# Patient Record
Sex: Female | Born: 1955 | Race: Black or African American | Hispanic: No | Marital: Married | Smoking: Never smoker
Health system: Southern US, Community
[De-identification: ages and names within clinical notes are randomized; demographics above are authoritative.]

## PROBLEM LIST (undated history)

## (undated) DIAGNOSIS — I700 Atherosclerosis of aorta: Secondary | ICD-10-CM

## (undated) DIAGNOSIS — T7840XA Allergy, unspecified, initial encounter: Secondary | ICD-10-CM

## (undated) DIAGNOSIS — E119 Type 2 diabetes mellitus without complications: Secondary | ICD-10-CM

## (undated) DIAGNOSIS — K219 Gastro-esophageal reflux disease without esophagitis: Secondary | ICD-10-CM

## (undated) DIAGNOSIS — R053 Chronic cough: Secondary | ICD-10-CM

## (undated) DIAGNOSIS — R05 Cough: Secondary | ICD-10-CM

## (undated) DIAGNOSIS — I10 Essential (primary) hypertension: Secondary | ICD-10-CM

## (undated) HISTORY — DX: Type 2 diabetes mellitus without complications: E11.9

## (undated) HISTORY — DX: Atherosclerosis of aorta: I70.0

## (undated) HISTORY — DX: Essential (primary) hypertension: I10

## (undated) HISTORY — DX: Allergy, unspecified, initial encounter: T78.40XA

## (undated) HISTORY — DX: Gastro-esophageal reflux disease without esophagitis: K21.9

## (undated) HISTORY — DX: Chronic cough: R05.3

## (undated) HISTORY — DX: Cough: R05

## (undated) SURGERY — MANOMETRY, ESOPHAGUS
Anesthesia: Choice

---

## 2006-03-21 ENCOUNTER — Encounter: Admission: RE | Admit: 2006-03-21 | Discharge: 2006-06-19 | Payer: Self-pay | Admitting: Internal Medicine

## 2009-04-06 ENCOUNTER — Encounter: Admission: RE | Admit: 2009-04-06 | Discharge: 2009-04-06 | Payer: Self-pay | Admitting: Internal Medicine

## 2011-04-21 ENCOUNTER — Emergency Department (HOSPITAL_COMMUNITY): Payer: 59

## 2011-04-21 ENCOUNTER — Emergency Department (HOSPITAL_COMMUNITY)
Admission: EM | Admit: 2011-04-21 | Discharge: 2011-04-22 | Disposition: A | Discharge status: Home / Self Care | Payer: 59 | Attending: Emergency Medicine | Admitting: Emergency Medicine

## 2011-04-21 DIAGNOSIS — R0602 Shortness of breath: Secondary | ICD-10-CM | POA: Insufficient documentation

## 2011-04-21 DIAGNOSIS — R059 Cough, unspecified: Secondary | ICD-10-CM | POA: Insufficient documentation

## 2011-04-21 DIAGNOSIS — R0982 Postnasal drip: Secondary | ICD-10-CM | POA: Insufficient documentation

## 2011-04-21 DIAGNOSIS — R05 Cough: Secondary | ICD-10-CM | POA: Insufficient documentation

## 2011-05-06 ENCOUNTER — Other Ambulatory Visit: Payer: Self-pay | Admitting: Internal Medicine

## 2011-05-06 DIAGNOSIS — Z1231 Encounter for screening mammogram for malignant neoplasm of breast: Secondary | ICD-10-CM

## 2011-05-17 ENCOUNTER — Ambulatory Visit: Payer: 59

## 2012-04-09 ENCOUNTER — Ambulatory Visit (INDEPENDENT_AMBULATORY_CARE_PROVIDER_SITE_OTHER): Payer: 59 | Admitting: Family Medicine

## 2012-04-09 VITALS — BP 146/84 | HR 81 | Temp 97.9°F | Resp 18 | Ht 61.0 in | Wt 177.0 lb

## 2012-04-09 DIAGNOSIS — Z0000 Encounter for general adult medical examination without abnormal findings: Secondary | ICD-10-CM

## 2012-04-09 MED ORDER — PREDNISONE 20 MG PO TABS: ORAL_TABLET | ORAL | Status: AC

## 2012-04-09 MED ORDER — FLUTICASONE PROPIONATE 50 MCG/ACT NA SUSP: 2.0000 | Freq: Every day | NASAL | Status: DC

## 2012-04-09 NOTE — Progress Notes (Signed)
  Subjective:    Patient ID: Patricia Murphy, female    DOB: 1956-10-22, 56 y.o.   MRN: [id number]  HPI 56 yo female here for cpe.  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  LSIL.  Referred for colpo.  Has been getting paps through gyn since.  Last was last year.  Plans to get pap done there again.  2) mammo - last mammo was 2 years ago.   3) Colonoscopy - had last year.  Was normal.  To repeat in 10 years.    H/o high blood pressure.  At one time was started on lisinopril 10 daily.  Didn't take them for very long.  Has been coughing a lot lately - month.  Mucus does come up - clear.  NO fever or runny nose.  Some sneezing.  Sometimes keeps her up at night.  Taking zyrtec but not helping.   Last ate 5:30 pm.       Review of Systems Negative except as per HPI     Objective:   Physical Exam  Constitutional: She appears well-developed. No distress.  HENT:  Right Ear: Tympanic membrane, external ear and ear canal normal. Tympanic membrane is not injected, not scarred, not perforated, not erythematous, not retracted and not bulging.  Left Ear: Tympanic membrane, external ear and ear canal normal. Tympanic membrane is not injected, not scarred, not perforated, not erythematous, not retracted and not bulging.  Nose: No mucosal edema or rhinorrhea. Right sinus exhibits no maxillary sinus tenderness and no frontal sinus tenderness. Left sinus exhibits no maxillary sinus tenderness and no frontal sinus tenderness.  Mouth/Throat: Uvula is midline, oropharynx is clear and moist and mucous membranes are normal. No oropharyngeal exudate or tonsillar abscesses.  Cardiovascular: Normal rate, regular rhythm, normal heart sounds and intact distal pulses.   No murmur heard. Pulmonary/Chest: Effort normal and breath sounds normal. No respiratory distress. She has no wheezes. She has no rales.  Lymphadenopathy:       Head (right side): No submandibular and no preauricular  adenopathy present.       Head (left side): No submandibular and no preauricular adenopathy present.       Right cervical: No superficial cervical and no posterior cervical adenopathy present.      Left cervical: No superficial cervical and no posterior cervical adenopathy present.       Right: No supraclavicular adenopathy present.       Left: No supraclavicular adenopathy present.  Skin: Skin is warm and dry.          Assessment & Plan:  PE - normal.  Advised to schedule mammo at her earliest convenience.  Gets paps elsewhere  Cough - likely related to allergies.  Pred taper and flonase.

## 2013-02-01 ENCOUNTER — Ambulatory Visit: Payer: 59

## 2013-02-01 ENCOUNTER — Ambulatory Visit (INDEPENDENT_AMBULATORY_CARE_PROVIDER_SITE_OTHER): Payer: 59 | Admitting: Emergency Medicine

## 2013-02-01 VITALS — BP 130/72 | HR 87 | Temp 98.3°F | Resp 16 | Ht 60.5 in | Wt 169.6 lb

## 2013-02-01 DIAGNOSIS — R05 Cough: Secondary | ICD-10-CM

## 2013-02-01 DIAGNOSIS — R1011 Right upper quadrant pain: Secondary | ICD-10-CM

## 2013-02-01 DIAGNOSIS — J209 Acute bronchitis, unspecified: Secondary | ICD-10-CM

## 2013-02-01 DIAGNOSIS — K219 Gastro-esophageal reflux disease without esophagitis: Secondary | ICD-10-CM

## 2013-02-01 LAB — COMPREHENSIVE METABOLIC PANEL
Albumin: 4.3 g/dL (ref 3.5–5.2)
Alkaline Phosphatase: 147 U/L — ABNORMAL HIGH (ref 39–117)
BUN: 13 mg/dL (ref 6–23)
Creat: 0.88 mg/dL (ref 0.50–1.10)
Glucose, Bld: 105 mg/dL — ABNORMAL HIGH (ref 70–99)
Total Bilirubin: 0.6 mg/dL (ref 0.3–1.2)

## 2013-02-01 LAB — POCT CBC
MCH, POC: 27.2 pg (ref 27–31.2)
MCHC: 30.5 g/dL — AB (ref 31.8–35.4)
MCV: 89.1 fL (ref 80–97)
MID (cbc): 0.5 (ref 0–0.9)
POC LYMPH PERCENT: 41.8 %L (ref 10–50)
POC MID %: 8.2 %M (ref 0–12)
Platelet Count, POC: 397 10*3/uL (ref 142–424)
RDW, POC: 14.4 %
WBC: 5.9 10*3/uL (ref 4.6–10.2)

## 2013-02-01 LAB — LIPASE: Lipase: 18 U/L (ref 0–75)

## 2013-02-01 MED ORDER — ALBUTEROL SULFATE HFA 108 (90 BASE) MCG/ACT IN AERS: 2.0000 | INHALATION_SPRAY | RESPIRATORY_TRACT | Status: DC | PRN

## 2013-02-01 MED ORDER — LANSOPRAZOLE 30 MG PO CPDR: 30.0000 mg | DELAYED_RELEASE_CAPSULE | Freq: Every day | ORAL | Status: DC

## 2013-02-01 MED ORDER — HYDROCOD POLST-CHLORPHEN POLST 10-8 MG/5ML PO LQCR: 5.0000 mL | Freq: Two times a day (BID) | ORAL | Status: DC | PRN

## 2013-02-01 NOTE — Patient Instructions (Addendum)

## 2013-02-01 NOTE — Progress Notes (Signed)
[facility] [address], [place] [address] [phone]  Date:  02/01/2013   Name:  Patricia Murphy   DOB:  20-May-1956   MRN:  [id number]  PCP:  Provider Not In System    Chief Complaint: Cough, Gas, Foot Swelling and Gastrophageal Reflux   History of Present Illness:  Patricia Murphy is a 57 y.o. very pleasant female patient who presents with the following:  Has numerous complaints.  Has been taking micardis for this month, thinking it was for GERD, rather than hypertension.  Now has swelling of the ankles for this past week.  No diet changes, chest pain, shortness of breath, immobilization.  Other complaint. Has cough that is productive of mucoid sputum started in January  No wheezing or shortness of breath, nausea or vomiting, coryza.  No orthopnea or PND.  Taking claritin D for allergies.  Denies waterbrash. No history of asthma. Has frequent heartburn with burning in chest at times.  Particular trouble with greasy or fatty food and barbecue. RUQ abdominal pain through to back that is associated with belching and distension.  No nausea or vomiting or fever or chills  There is no problem list on file for this patient.   History reviewed. No pertinent past medical history.  History reviewed. No pertinent past surgical history.  History  Substance Use Topics  . Smoking status: Never Smoker   . Smokeless tobacco: Not on file  . Alcohol Use: No    History reviewed. No pertinent family history.  Allergies  Allergen Reactions  . Penicillins     Medication list has been reviewed and updated.  Current Outpatient Prescriptions on File Prior to Visit  Medication Sig Dispense Refill  . fluticasone (FLONASE) 50 MCG/ACT nasal spray Place 2 sprays into the nose daily.  16 g  6   No current facility-administered medications on file prior to visit.    Review of Systems:  As per HPI, otherwise negative.    Physical Examination: Filed Vitals:   02/01/13 1032  BP: 130/72  Pulse: 87  Temp: 98.3 F (36.8 C)  Resp: 16   Filed Vitals:   02/01/13 1032  Height: 5' 0.5" (1.537 m)  Weight: 169 lb 9.6 oz (76.93 kg)   Body mass index is 32.56 kg/(m^2). Ideal Body Weight: Weight in (lb) to have BMI = 25: 129.9  GEN: WDWN, NAD, Non-toxic, A & O x 3 HEENT: Atraumatic, Normocephalic. Neck supple. No masses, No LAD.  No JVD or HJR Ears and Nose: No external deformity. CV: RRR, No M/G/R. No JVD. No thrill. No extra heart sounds. PULM: CTA B, no wheezes, crackles, rhonchi. No retractions. No resp. distress. No accessory muscle use. ABD: S, NT, ND, +BS. No rebound. No HSM. EXTR: No c/c/e, no calf tenderness NEURO Normal gait.  PSYCH: Normally interactive. Conversant. Not depressed or anxious appearing.  Calm demeanor.    Assessment and Plan: RUQ pain GERD Prevacid COUGH 2nd bronchitis Albuterol MDI tussionex CXR Labs Sono GB if negative a HIDDA scan with CCK  Carmelina Dane, MD  Results for orders placed in visit on 02/01/13  POCT CBC      Result Value Range   WBC 5.9  4.6 - 10.2 K/uL   Lymph, poc 2.5  0.6 - 3.4   POC LYMPH PERCENT 41.8  10 - 50 %L   MID (cbc) 0.5  0 - 0.9   POC MID % 8.2  0 - 12 %M  POC Granulocyte 2.9  2 - 6.9   Granulocyte percent 50.0  37 - 80 %G   RBC 4.70  4.04 - 5.48 M/uL   Hemoglobin 12.8  12.2 - 16.2 g/dL   HCT, POC 40.9  81.1 - 47.9 %   MCV 89.1  80 - 97 fL   MCH, POC 27.2  27 - 31.2 pg   MCHC 30.5 (*) 31.8 - 35.4 g/dL   RDW, POC 91.4     Platelet Count, POC 397  142 - 424 K/uL   MPV 9.4  0 - 99.8 fL    [facility] reading (PRIMARY) by  Dr. Dareen Piano.  Negative chest.

## 2013-02-05 ENCOUNTER — Telehealth: Payer: Self-pay

## 2013-02-05 MED ORDER — METRONID-TETRACYC-BIS SUBSAL PO MISC: ORAL | Status: DC

## 2013-02-05 NOTE — Telephone Encounter (Signed)
Called no answer will try again later

## 2013-02-05 NOTE — Telephone Encounter (Signed)
Dr. Dareen Piano, patient is allergic to Penicillin.  Please order medication for positive H pylori.

## 2013-02-05 NOTE — Telephone Encounter (Signed)
[facility] SAYS THEY RECEIVED LANSOPRAZOLE RX BUT DID NOT RECEIVE ALBUTEROL AND IS ASKING TO SEND IT AGAIN. PATIENT ALSO SAYS THERE WAS A THIRD RX, IT LOOKS LIKE IT WAS TUSSIONEX AND I TOLD HER PATIENT SHOULD HAVE HARD COPY. IF I AM INCORRECT ON THIS THEY WILL ALSO NEED TUSSIONEX SENT IN.

## 2013-02-06 NOTE — Telephone Encounter (Signed)
Pharmacist called back and stated that a prior auth is needed for Prevacid. Called and spoke w/ pt and explained that she needs to pick up the new Rx for her H Pylori that we sent to the pharmacy yesterday. Asked pt if she has taken omeprazole for her stomach before. She stated that she is taking it now, but would rather have the Prevacid the doctor Rxd for her if ins will cover it. Pt stated that she got her inhaler so there is not a problem w/it.   Called and completed a prior auth over the phone for lansoprazole and was advised it is not covered by plan even w/a prior auth. Ins covers omeprazole and pantoprazole (both 20 mg and 40 mg of both meds). Dr Dareen Piano, do you want to Rx one of these for the pt?

## 2013-02-06 NOTE — Progress Notes (Signed)
Reviewed and agree.

## 2013-02-06 NOTE — Telephone Encounter (Signed)
Called pharmacy left message for them to call back about this patient, so I can determine what she needs.

## 2013-02-07 NOTE — Telephone Encounter (Signed)
Pt called back to see if Dr Dareen Piano is going to call in another Rx for her instead of the prevacid that is not covered by ins. I instructed pt again (I had d/w pt yesterday) to p/up her combination Rx w/the Abxs from the pharmacy and to start taking that along with the OTC omeprazole that she already has until we call her back concerning whether Dr Dareen Piano wants to send in another PPI Rx.  Pt called back and stated that she had called pharmacy and they had some question about her medication, but that she would [facility] get it at the [facility] on [address]. I called [facility] [address] to find out the problem and they advised the metronidazole-tetracycline-bismuth pack is called Pylera and wanted to know directions. I instr'd that the sig is directions on package. Pharmacy tech ran claim and it was accepted by ins for a $40 co-pay. I called [facility] and ordered it for the pt at their location and pharmacist stated it has to be ordered and will be in tomorrow. Notified pt and she agreed to check tomorrow w/pharmacy.  Dr Dareen Piano, do you want to Rx pantoprazole or omeprazole 40 for pt to take instead of the prevacid which is not covered by ins, or have the pt continue taking the OTC omeprazole 20 mg? Please advise. It needs to be sent to [facility].

## 2013-02-09 NOTE — Telephone Encounter (Signed)
She can take TWO over the counter prevacid.

## 2013-02-12 NOTE — Telephone Encounter (Signed)
Gave pt instr's to take two OTC prevacid QD which will be the same dose as if she took the Rx strength Prevacid. Pt agreed.

## 2013-03-04 ENCOUNTER — Telehealth: Payer: Self-pay

## 2013-03-04 ENCOUNTER — Ambulatory Visit: Payer: 59

## 2013-03-04 ENCOUNTER — Ambulatory Visit (INDEPENDENT_AMBULATORY_CARE_PROVIDER_SITE_OTHER): Payer: 59 | Admitting: Emergency Medicine

## 2013-03-04 VITALS — BP 126/78 | HR 97 | Temp 98.0°F | Resp 16 | Ht 61.5 in | Wt 161.0 lb

## 2013-03-04 DIAGNOSIS — J209 Acute bronchitis, unspecified: Secondary | ICD-10-CM

## 2013-03-04 DIAGNOSIS — R05 Cough: Secondary | ICD-10-CM

## 2013-03-04 DIAGNOSIS — R059 Cough, unspecified: Secondary | ICD-10-CM

## 2013-03-04 DIAGNOSIS — H539 Unspecified visual disturbance: Secondary | ICD-10-CM

## 2013-03-04 MED ORDER — HYDROCOD POLST-CHLORPHEN POLST 10-8 MG/5ML PO LQCR: 5.0000 mL | Freq: Two times a day (BID) | ORAL | Status: DC | PRN

## 2013-03-04 MED ORDER — ALBUTEROL SULFATE HFA 108 (90 BASE) MCG/ACT IN AERS: 2.0000 | INHALATION_SPRAY | RESPIRATORY_TRACT | Status: DC | PRN

## 2013-03-04 NOTE — Patient Instructions (Addendum)

## 2013-03-04 NOTE — Telephone Encounter (Signed)
Patient states here medication is causing her issues.  Affecting her eye sight.  [phone]

## 2013-03-04 NOTE — Telephone Encounter (Signed)
She states her vision has decreased. I have advised her to return to clinic, she will come in today.

## 2013-03-04 NOTE — Progress Notes (Signed)
[facility] [address] [place] [address] [phone]  Date:  03/04/2013   Name:  Patricia Murphy   DOB:  1956-10-24   MRN:  [id number]  PCP:  Provider Not In System    Chief Complaint: Blurred Vision, Arm Pain and Cough   History of Present Illness:  Patricia Murphy is a 57 y.o. very pleasant female patient who presents with the following:  Seen a month ago with RUQ pain and GERD that have both resolved.  Continues to have persistent non productive cough with some occasional wheezing.  No shortness of breath.  No nausea or vomiting.  No food intolerance.  Describes left eye visual change for past week and says she has a sense of a green film covering her eye with blurred vision.  Now seeing "designs" in her eye.  Claims associated with taking medication for Hpylori.  Patient Active Problem List  Diagnosis  . GERD (gastroesophageal reflux disease)  . RUQ pain    No past medical history on file.  No past surgical history on file.  History  Substance Use Topics  . Smoking status: Never Smoker   . Smokeless tobacco: Not on file  . Alcohol Use: No    No family history on file.  Allergies  Allergen Reactions  . Penicillins     Medication list has been reviewed and updated.  Current Outpatient Prescriptions on File Prior to Visit  Medication Sig Dispense Refill  . albuterol (PROVENTIL HFA;VENTOLIN HFA) 108 (90 BASE) MCG/ACT inhaler Inhale 2 puffs into the lungs every 4 (four) hours as needed for wheezing (cough, shortness of breath or wheezing.).  1 Inhaler  1  . chlorpheniramine-HYDROcodone (TUSSIONEX PENNKINETIC ER) 10-8 MG/5ML LQCR Take 5 mLs by mouth every 12 (twelve) hours as needed (cough).  60 mL  0  . fluticasone (FLONASE) 50 MCG/ACT nasal spray Place 2 sprays into the nose daily.  16 g  6  . lansoprazole (PREVACID) 30 MG capsule Take 1 capsule (30 mg total) by mouth daily.  30 capsule  2  . loratadine-pseudoephedrine (CLARITIN-D  24-HOUR) 10-240 MG per 24 hr tablet Take 1 tablet by mouth daily.      . Metronid-Tetracyc-Bis Subsal MISC As directed on package  224 each  0  . telmisartan (MICARDIS) 20 MG tablet Take 20 mg by mouth daily.       No current facility-administered medications on file prior to visit.    Review of Systems:  As per HPI, otherwise negative.    Physical Examination: Filed Vitals:   03/04/13 1325  BP: 126/78  Pulse: 97  Temp: 98 F (36.7 C)  Resp: 16   Filed Vitals:   03/04/13 1325  Height: 5' 1.5" (1.562 m)  Weight: 161 lb (73.029 kg)   Body mass index is 29.93 kg/(m^2). Ideal Body Weight: Weight in (lb) to have BMI = 25: 134.2  GEN: WDWN, NAD, Non-toxic, A & O x 3 HEENT: Atraumatic, Normocephalic. Neck supple. No masses, No LAD.  Fundi benign.  PRRERLA EOMI conjunctiva and sclera are clear Ears and Nose: No external deformity. CV: RRR, No M/G/R. No JVD. No thrill. No extra heart sounds. PULM: CTA B, no wheezes, crackles, rhonchi. No retractions. No resp. distress. No accessory muscle use. ABD: S, NT, ND, +BS. No rebound. No HSM. EXTR: No c/c/e NEURO Normal gait.  PSYCH: Normally interactive. Conversant. Not depressed or anxious appearing.  Calm demeanor.    Assessment and Plan: Bronchitis with  bronchospasm Use your MDI as directed Vision change Eye consult   Signed,  Phillips Odor, MD   [facility] reading (PRIMARY) by  Dr. Dareen Piano.  Negative chest.

## 2013-03-07 ENCOUNTER — Telehealth: Payer: Self-pay

## 2013-03-07 NOTE — Telephone Encounter (Signed)
Dr. Dareen Piano  Please call Dr. Dione Booze  This is an interesting patient.  Identify yourself to the staff and they have been instructed to get him for you.   [phone]

## 2013-03-08 DIAGNOSIS — H538 Other visual disturbances: Secondary | ICD-10-CM | POA: Insufficient documentation

## 2013-03-08 DIAGNOSIS — H547 Unspecified visual loss: Secondary | ICD-10-CM | POA: Insufficient documentation

## 2013-03-08 NOTE — Progress Notes (Signed)
Reviewed and agree.

## 2013-03-29 DIAGNOSIS — H469 Unspecified optic neuritis: Secondary | ICD-10-CM | POA: Insufficient documentation

## 2013-06-30 DIAGNOSIS — R739 Hyperglycemia, unspecified: Secondary | ICD-10-CM | POA: Insufficient documentation

## 2013-06-30 DIAGNOSIS — I10 Essential (primary) hypertension: Secondary | ICD-10-CM | POA: Insufficient documentation

## 2013-06-30 DIAGNOSIS — T380X5A Adverse effect of glucocorticoids and synthetic analogues, initial encounter: Secondary | ICD-10-CM | POA: Insufficient documentation

## 2013-07-26 ENCOUNTER — Other Ambulatory Visit: Payer: Self-pay

## 2013-07-26 DIAGNOSIS — Z1231 Encounter for screening mammogram for malignant neoplasm of breast: Secondary | ICD-10-CM

## 2013-08-01 ENCOUNTER — Ambulatory Visit (INDEPENDENT_AMBULATORY_CARE_PROVIDER_SITE_OTHER): Payer: 59 | Admitting: Family Medicine

## 2013-08-01 ENCOUNTER — Ambulatory Visit: Payer: 59

## 2013-08-01 VITALS — BP 178/94 | HR 88 | Temp 98.2°F | Resp 16 | Ht 61.5 in | Wt 177.0 lb

## 2013-08-01 DIAGNOSIS — W19XXXA Unspecified fall, initial encounter: Secondary | ICD-10-CM

## 2013-08-01 DIAGNOSIS — R05 Cough: Secondary | ICD-10-CM

## 2013-08-01 DIAGNOSIS — M25572 Pain in left ankle and joints of left foot: Secondary | ICD-10-CM

## 2013-08-01 DIAGNOSIS — M25559 Pain in unspecified hip: Secondary | ICD-10-CM

## 2013-08-01 DIAGNOSIS — M25562 Pain in left knee: Secondary | ICD-10-CM

## 2013-08-01 DIAGNOSIS — M25569 Pain in unspecified knee: Secondary | ICD-10-CM

## 2013-08-01 DIAGNOSIS — M25579 Pain in unspecified ankle and joints of unspecified foot: Secondary | ICD-10-CM

## 2013-08-01 DIAGNOSIS — M25552 Pain in left hip: Secondary | ICD-10-CM

## 2013-08-01 MED ORDER — OMEPRAZOLE 40 MG PO CPDR: 40.0000 mg | DELAYED_RELEASE_CAPSULE | Freq: Every day | ORAL | Status: DC

## 2013-08-01 MED ORDER — HYDROCOD POLST-CHLORPHEN POLST 10-8 MG/5ML PO LQCR: 5.0000 mL | Freq: Two times a day (BID) | ORAL | Status: DC | PRN

## 2013-08-01 MED ORDER — BENZONATATE 100 MG PO CAPS: 100.0000 mg | ORAL_CAPSULE | Freq: Three times a day (TID) | ORAL | Status: DC | PRN

## 2013-08-01 MED ORDER — MELOXICAM 15 MG PO TABS: 15.0000 mg | ORAL_TABLET | Freq: Every day | ORAL | Status: DC

## 2013-08-01 NOTE — Patient Instructions (Addendum)
1.  ICE KNEE AND ANKLE TWICE DAILY FOR 15 MINUTES FOR THE NEXT FIVE DAYS. 2.  WEAR KNEE BRACE FOR SUPPORT FOR THE NEXT 1-2 WEEKS. 3. IF NO IMPROVEMENT IN TWO WEEKS, CALL FOR REFERRAL TO ORTHOPEDIC SPECIALIST. 4.  WEAR SUPPORTIVE TENNIS SHOES FOR THE NEXT 1-2 WEEKS.  NO FLIP FLOPS; NO BAREFOOT.

## 2013-08-01 NOTE — Progress Notes (Signed)
[address]   [place], [place]  [address]   [phone]  Subjective:    Patient ID: Patricia Murphy, female    DOB: 1956/08/31, 57 y.o.   MRN: [id number]  HPI This 57 y.o. female presents for evaluation of the following:  1.  L leg pain: at funeral today; walking and fell on steps.  Missed a step; landed on L leg; L leg twisted.  Toe bend tall the way back.  L ankle pain, L knee pain, L hip/thigh pain.  2.  R ankle swelling:  Also having mild lateral ankle pain; mild limping.    3.  L knee pain:  After fall today.  L knee swelling; +TTP.  Pain with weightbearing. No popping; no giving out.  4.  L thigh pain: onset after fall today.  Pain in lateral thigh.    5. Cough: chronic cough for past six months; has established with PCP/[facility]; s/p pulmonology consult; concern for sarcoidosis but lung biopsy negative.  No asthma per pulmonology.  Dr. Allyne Gee is concerned of GERD induced cough.  Taking Omeprazole.  No GI consultation.    6. HTN: stopped micardis; no medication: BP has been stable.  Review of Systems  Constitutional: Negative for fever, chills, diaphoresis and fatigue.  Respiratory: Positive for cough. Negative for shortness of breath, wheezing and stridor.   Musculoskeletal: Positive for myalgias, joint swelling, arthralgias and gait problem. Negative for back pain.  Skin: Negative for wound.  Neurological: Negative for weakness.   Past Medical History  Diagnosis Date  . Allergy    History reviewed. No pertinent past surgical history. Allergies  Allergen Reactions  . Ivp Dye [Iodinated Diagnostic Agents] Nausea And Vomiting  . Penicillins    Current Outpatient Prescriptions on File Prior to Visit  Medication Sig Dispense Refill  . omeprazole (PRILOSEC) 20 MG capsule Take 20 mg by mouth 2 (two) times daily.      Marland Kitchen albuterol (PROVENTIL HFA;VENTOLIN HFA) 108 (90 BASE) MCG/ACT inhaler Inhale 2 puffs into the lungs every 4 (four) hours as needed for wheezing (cough,  shortness of breath or wheezing.).  1 Inhaler  12  . bismuth-metronidazole-tetracycline (PLYERA) 140-125-125 MG per capsule Take 3 capsules by mouth 4 (four) times daily -  before meals and at bedtime.      . chlorpheniramine-HYDROcodone (TUSSIONEX PENNKINETIC ER) 10-8 MG/5ML LQCR Take 5 mLs by mouth every 12 (twelve) hours as needed (cough).  60 mL  0  . fluticasone (FLONASE) 50 MCG/ACT nasal spray Place 2 sprays into the nose daily.  16 g  6  . lansoprazole (PREVACID) 30 MG capsule Take 1 capsule (30 mg total) by mouth daily.  30 capsule  2  . loratadine-pseudoephedrine (CLARITIN-D 24-HOUR) 10-240 MG per 24 hr tablet Take 1 tablet by mouth daily.      . Metronid-Tetracyc-Bis Subsal MISC As directed on package  224 each  0  . telmisartan (MICARDIS) 20 MG tablet Take 20 mg by mouth daily.       No current facility-administered medications on file prior to visit.   History   Social History  . Marital Status: Married    Spouse Name: N/A    Number of Children: N/A  . Years of Education: N/A   Occupational History  . Not on file.   Social History Main Topics  . Smoking status: Never Smoker   . Smokeless tobacco: Not on file  . Alcohol Use: No  . Drug Use: No  . Sexual Activity:  Yes    Birth Control/ Protection: None   Other Topics Concern  . Not on file   Social History Narrative  . No narrative on file   Ms. Sproles does not currently have medications on file.     Objective:   Physical Exam  Nursing note and vitals reviewed. Constitutional: She is oriented to person, place, and time. She appears well-developed and well-nourished. No distress.  HENT:  Head: Normocephalic and atraumatic.  Cardiovascular: Normal rate, regular rhythm and normal heart sounds.  Exam reveals no gallop and no friction rub.   No murmur heard. Pulmonary/Chest: Effort normal and breath sounds normal. She has no wheezes. She has no rales.  Musculoskeletal:       Left hip: She exhibits tenderness.  She exhibits normal range of motion, normal strength, no bony tenderness and no crepitus.       Left knee: She exhibits decreased range of motion, swelling, effusion and bony tenderness. She exhibits no ecchymosis, no erythema, normal alignment, no LCL laxity, normal meniscus and no MCL laxity. Tenderness found. No medial joint line, no lateral joint line, no MCL, no LCL and no patellar tendon tenderness noted.       Right ankle: She exhibits decreased range of motion and swelling. She exhibits no ecchymosis, no deformity and no laceration. Tenderness. Lateral malleolus tenderness found. No medial malleolus, no AITFL, no CF ligament, no posterior TFL, no head of 5th metatarsal and no proximal fibula tenderness found. Achilles tendon normal.       Left ankle: She exhibits decreased range of motion. She exhibits no swelling, no ecchymosis and no deformity. Tenderness. Lateral malleolus tenderness found. No medial malleolus, no AITFL, no CF ligament, no posterior TFL, no head of 5th metatarsal and no proximal fibula tenderness found. Achilles tendon normal.       Legs:      Feet:  Neurological: She is alert and oriented to person, place, and time. No cranial nerve deficit. She exhibits normal muscle tone. Coordination normal.  Skin: Skin is warm and dry. No rash noted. She is not diaphoretic.  Psychiatric: She has a normal mood and affect. Her behavior is normal.    [facility] reading (PRIMARY) by  Dr. Katrinka Blazing.  L HIP: nad  L KNEE: nad; diffuse arthritic changes and spurring.   L ANKLE:  nad.      Assessment & Plan:  Knee pain, acute, left - Plan: DG Knee Complete 4 Views Left  Pain in joint, ankle and foot, left - Plan: DG Ankle Complete Left  Hip pain, acute, left - Plan: DG Hip Complete Left  Fall, initial encounter  Cough  1. L knee pain/contusion:  New.  Onset after fall; recommend rest, elevate, ice, Mobic.  If no improvement in two weeks, call for ortho referral.  Knee brace hinged fitted in  office. 2.  L ankle pain/strain:  New.  Secondary to fall.  Recommend rest, ice, elevation.  Rx for Mobic provided. 3.  L hip pain/contusion: New. Onset after fall.  Recommend rest, ice.   4.  Fall:  New. With multiple contusions of LLE.   5. Chronic cough:  Uncontrolled; undergoing evaluation by pulmonology and PCP.  Rx for Tussionex and Tessalon Perles provided in office; constant hacking cough during visit.  Work up by pulmonology negative; rx for Omeprazole 40mg  daily provided to treat underlying GERD.   Meds ordered this encounter  Medications  . Multiple Vitamin (MULTIVITAMIN) tablet    Sig: Take 1 tablet by  mouth daily.  . cholecalciferol (VITAMIN D) 1000 UNITS tablet    Sig: Take 500 Units by mouth daily.  Marland Kitchen omeprazole (PRILOSEC) 40 MG capsule    Sig: Take 1 capsule (40 mg total) by mouth daily.    Dispense:  30 capsule    Refill:  5  . chlorpheniramine-HYDROcodone (TUSSIONEX PENNKINETIC ER) 10-8 MG/5ML LQCR    Sig: Take 5 mLs by mouth every 12 (twelve) hours as needed (cough).    Dispense:  240 mL    Refill:  0  . benzonatate (TESSALON) 100 MG capsule    Sig: Take 1-2 capsules (100-200 mg total) by mouth 3 (three) times daily as needed for cough.    Dispense:  60 capsule    Refill:  3  . meloxicam (MOBIC) 15 MG tablet    Sig: Take 1 tablet (15 mg total) by mouth daily.    Dispense:  30 tablet    Refill:  0

## 2013-08-14 ENCOUNTER — Ambulatory Visit
Admission: RE | Admit: 2013-08-14 | Discharge: 2013-08-14 | Disposition: A | Discharge status: Home / Self Care | Payer: 59 | Source: Ambulatory Visit

## 2013-08-14 DIAGNOSIS — Z1231 Encounter for screening mammogram for malignant neoplasm of breast: Secondary | ICD-10-CM

## 2013-08-20 IMAGING — CR DG CHEST 2V
2 series · 2 of 2 positions shown · non-contrast
Comparison: 02/01/2013

CLINICAL DATA: Shortness of breath.

CHEST - 2 VIEW

[PA]
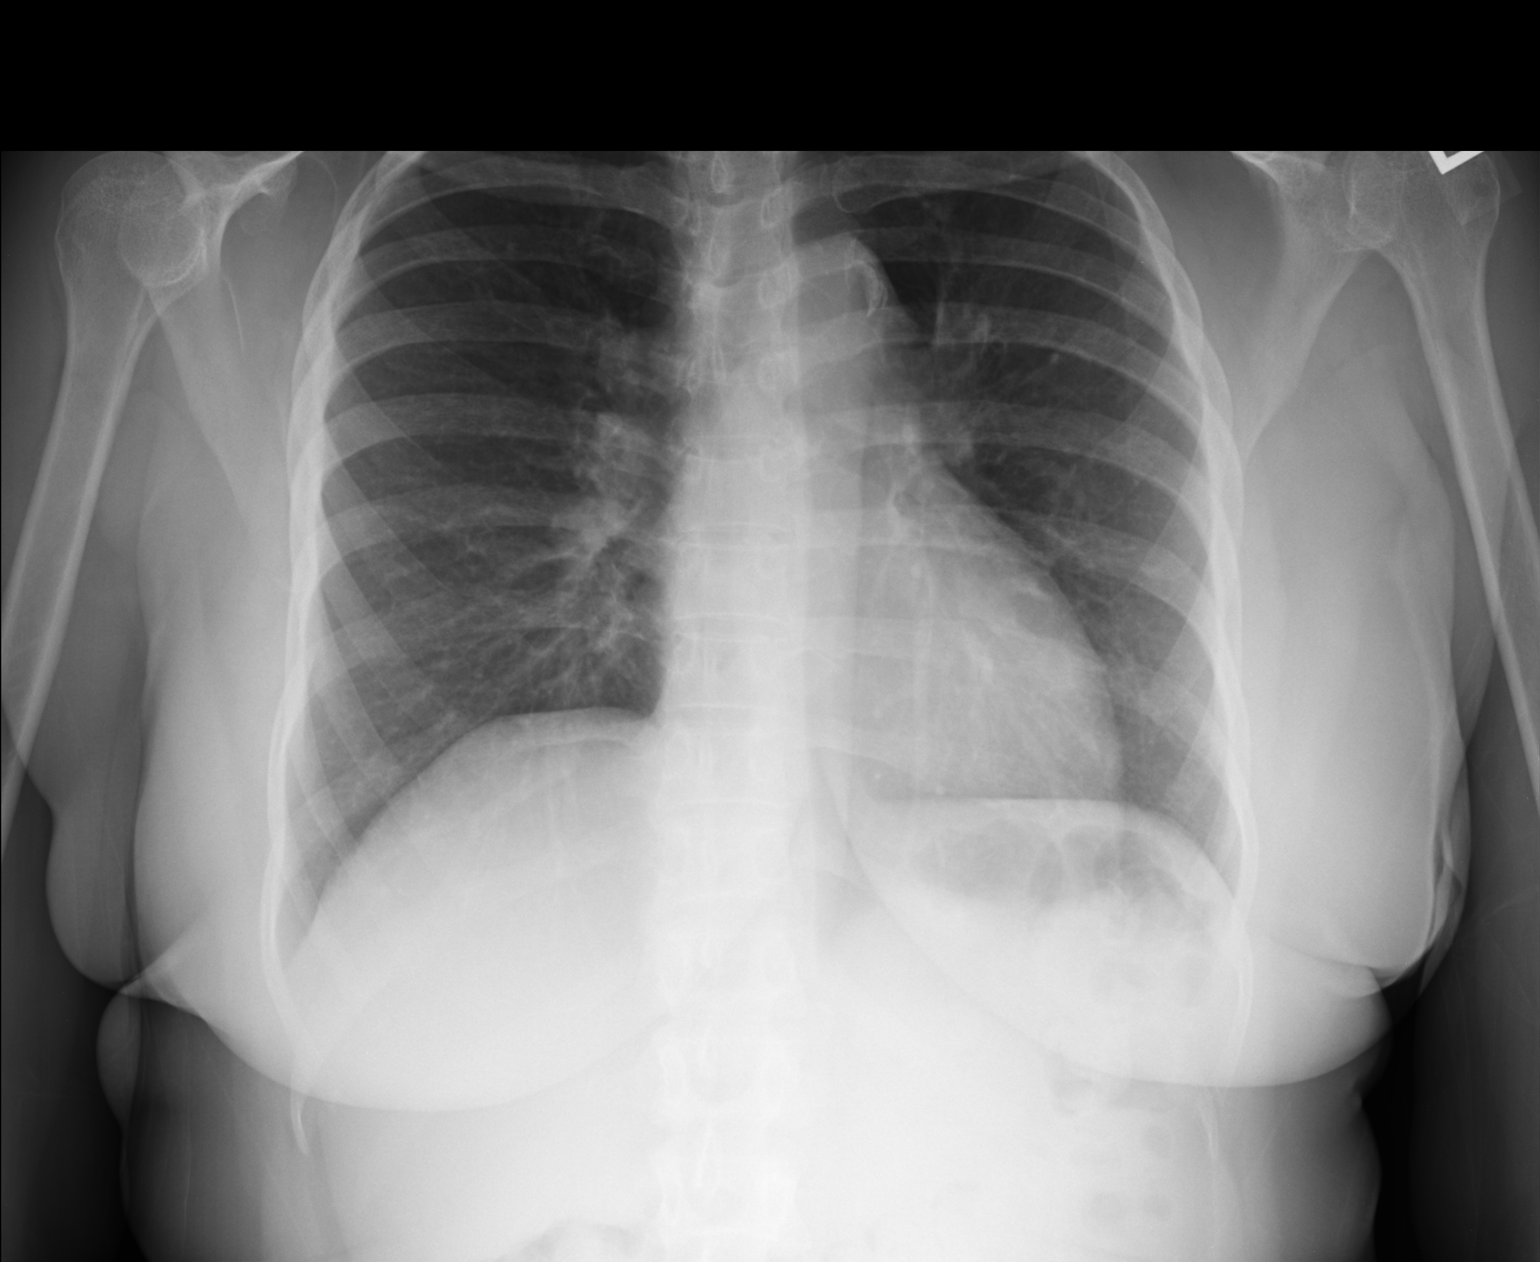

[lateral]
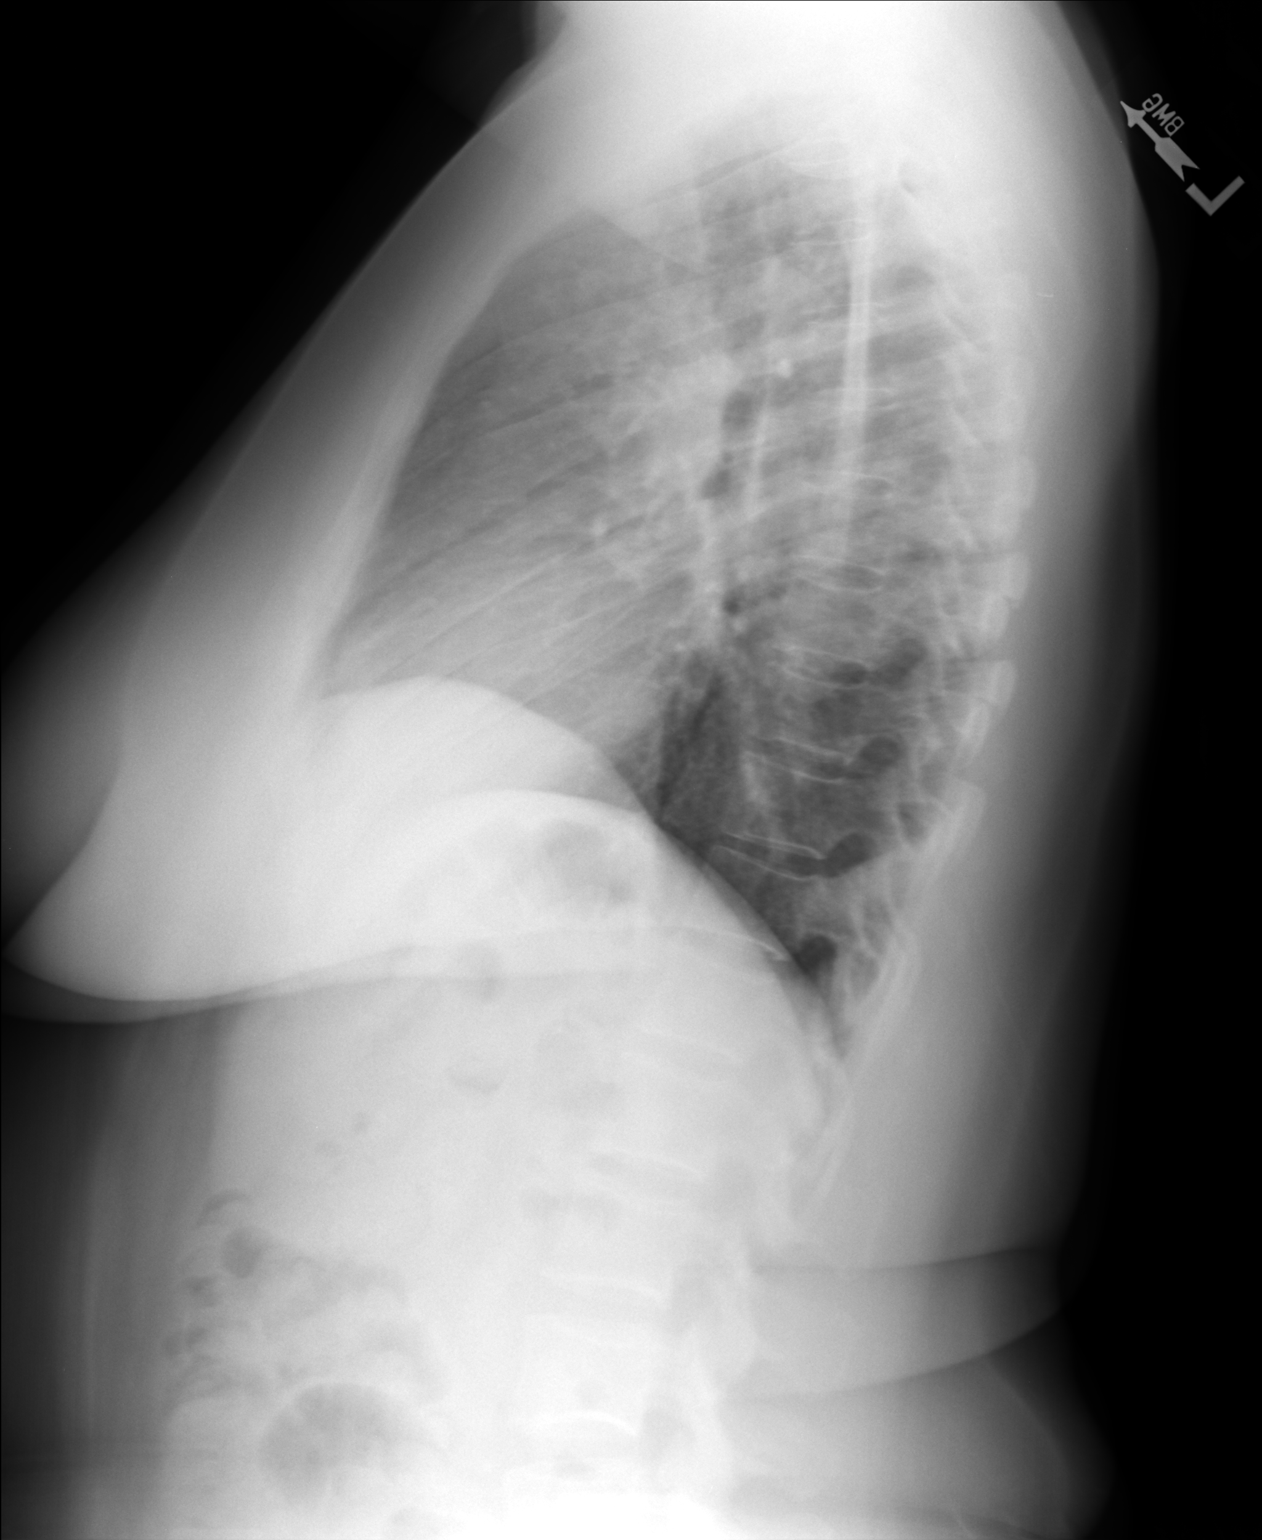

[2 of 2 positions shown; findings below may reference images not displayed]

FINDINGS: Heart and mediastinal contours are within normal limits.
No focal opacities or effusions.  No acute bony abnormality.
IMPRESSION: No active cardiopulmonary disease.

## 2013-08-24 ENCOUNTER — Ambulatory Visit: Payer: 59

## 2013-08-24 ENCOUNTER — Ambulatory Visit (INDEPENDENT_AMBULATORY_CARE_PROVIDER_SITE_OTHER): Payer: 59 | Admitting: Family Medicine

## 2013-08-24 VITALS — BP 140/86 | HR 80 | Temp 97.9°F | Resp 16 | Ht 60.5 in | Wt 178.0 lb

## 2013-08-24 DIAGNOSIS — M25572 Pain in left ankle and joints of left foot: Secondary | ICD-10-CM

## 2013-08-24 DIAGNOSIS — M25579 Pain in unspecified ankle and joints of unspecified foot: Secondary | ICD-10-CM

## 2013-08-24 DIAGNOSIS — T148XXA Other injury of unspecified body region, initial encounter: Secondary | ICD-10-CM

## 2013-08-24 NOTE — Progress Notes (Signed)
[facility]:  Office Visit  Chief Complaint:  Chief Complaint  Patient presents with  . Foot Injury    left x 1 day    HPI: Patricia Murphy is a 57 y.o. female who complains of  Left foot pain and left 2nd toe pain since last night when she was at the [employer] last night, can of beans fell on her toe. She thinks it was about 20 oz. She has some throbbing pain. She has problems putting weight on the bottom of her foot. She has had injuries to that leg about 1 week ago, had an ankle sprain. No prior surgeries. + swelling, pain. She has not been weightbearing. She did not take any medications.  She had her annual exam and HbA1c was 7, was put on metfomrin. Has not taken any meds except for GERD, cough.No nunbness/weakness or tingling   Past Medical History  Diagnosis Date  . Allergy   . Hypertension   . Diabetes mellitus without complication     due to 6 months of steroids, was never high prior to steroid use for retinitis   History reviewed. No pertinent past surgical history. History   Social History  . Marital Status: Married    Spouse Name: N/A    Number of Children: N/A  . Years of Education: N/A   Social History Main Topics  . Smoking status: Never Smoker   . Smokeless tobacco: None  . Alcohol Use: No  . Drug Use: No  . Sexual Activity: Yes    Birth Control/ Protection: None   Other Topics Concern  . None   Social History Narrative  . None   Family History  Problem Relation Age of Onset  . Heart disease Mother   . Heart disease Father   . Heart disease Maternal Grandmother    Allergies  Allergen Reactions  . Ivp Dye [Iodinated Diagnostic Agents] Nausea And Vomiting  . Penicillins    Prior to Admission medications   Medication Sig Start Date End Date Taking? Authorizing Provider  albuterol (PROVENTIL HFA;VENTOLIN HFA) 108 (90 BASE) MCG/ACT inhaler Inhale 2 puffs into the lungs every 4 (four) hours as needed for wheezing (cough,  shortness of breath or wheezing.). 03/04/13  Yes Phillips Odor, MD  benzonatate (TESSALON) 100 MG capsule Take 1-2 capsules (100-200 mg total) by mouth 3 (three) times daily as needed for cough. 08/01/13  Yes Ethelda Chick, MD  bismuth-metronidazole-tetracycline [facility]) [phone] MG per capsule Take 3 capsules by mouth 4 (four) times daily -  before meals and at bedtime.   Yes Historical Provider, MD  chlorpheniramine-HYDROcodone (TUSSIONEX PENNKINETIC ER) 10-8 MG/5ML LQCR Take 5 mLs by mouth every 12 (twelve) hours as needed (cough). 08/01/13  Yes Ethelda Chick, MD  cholecalciferol (VITAMIN D) 1000 UNITS tablet Take 500 Units by mouth daily.   Yes Historical Provider, MD  lansoprazole (PREVACID) 30 MG capsule Take 1 capsule (30 mg total) by mouth daily. 02/01/13  Yes Phillips Odor, MD  loratadine-pseudoephedrine (CLARITIN-D 24-HOUR) 10-240 MG per 24 hr tablet Take 1 tablet by mouth daily.   Yes Historical Provider, MD  meloxicam (MOBIC) 15 MG tablet Take 1 tablet (15 mg total) by mouth daily. 08/01/13  Yes Ethelda Chick, MD  Metronid-Tetracyc-Bis [place] MISC As directed on package 02/05/13  Yes Phillips Odor, MD  Multiple Vitamin (MULTIVITAMIN) tablet Take 1 tablet by mouth daily.   Yes Historical Provider, MD  omeprazole (PRILOSEC) 40 MG capsule Take 1 capsule (  40 mg total) by mouth daily. 08/01/13  Yes Ethelda Chick, MD  telmisartan (MICARDIS) 20 MG tablet Take 20 mg by mouth daily.   Yes Historical Provider, MD  fluticasone (FLONASE) 50 MCG/ACT nasal spray Place 2 sprays into the nose daily. 04/09/12 04/09/13  Lamar Laundry, MD     ROS: The patient denies fevers, chills, night sweats, unintentional weight loss, chest pain, palpitations, wheezing, dyspnea on exertion, nausea, vomiting, abdominal pain, dysuria, hematuria, melena, numbness, weakness, or tingling.   All other systems have been reviewed and were otherwise negative with the exception of those mentioned in the HPI and as  above.    PHYSICAL EXAM: Filed Vitals:   08/24/13 0913  BP: 140/86  Pulse: 80  Temp: 97.9 F (36.6 C)  Resp: 16   Filed Vitals:   08/24/13 0913  Height: 5' 0.5" (1.537 m)  Weight: 178 lb (80.74 kg)   Body mass index is 34.18 kg/(m^2).  General: Alert, no acute distress HEENT:  Normocephalic, atraumatic, oropharynx patent. EOMI, PERRLA Cardiovascular:  Regular rate and rhythm, no rubs murmurs or gallops.  No Carotid bruits, radial pulse intact. No pedal edema.  Respiratory: Clear to auscultation bilaterally.  No wheezes, rales, or rhonchi.  No cyanosis, no use of accessory musculature GI: No organomegaly, abdomen is soft and non-tender, positive bowel sounds.  No masses. Skin: No rashes. Neurologic: Facial musculature symmetric. Psychiatric: Patient is appropriate throughout our interaction. Lymphatic: No cervical lymphadenopathy Musculoskeletal: Gait antalgic due to pain Left foot-+ DP, 2nd toe at MTP tender and also around that area, full ROM, + swelling, no ecchymosis, no warmth or erythema, 5/5 strength in foot Ankle-no latearal or medial malleoli tenderness   LABS: Results for orders placed in visit on 02/01/13  H. PYLORI ANTIBODY, IGG      Result Value Range   H Pylori IgG 2.30 (*)   COMPREHENSIVE METABOLIC PANEL      Result Value Range   Sodium 141  135 - 145 mEq/L   Potassium 4.4  3.5 - 5.3 mEq/L   Chloride 104  96 - 112 mEq/L   CO2 23  19 - 32 mEq/L   Glucose, Bld 105 (*) 70 - 99 mg/dL   BUN 13  6 - 23 mg/dL   Creat 1.61  0.96 - 0.45 mg/dL   Total Bilirubin 0.6  0.3 - 1.2 mg/dL   Alkaline Phosphatase 147 (*) 39 - 117 U/L   AST 45 (*) 0 - 37 U/L   ALT 47 (*) 0 - 35 U/L   Total Protein 7.9  6.0 - 8.3 g/dL   Albumin 4.3  3.5 - 5.2 g/dL   Calcium 40.9  8.4 - 81.1 mg/dL  AMYLASE      Result Value Range   Amylase 100  0 - 105 U/L  LIPASE      Result Value Range   Lipase 18  0 - 75 U/L  POCT CBC      Result Value Range   WBC 5.9  4.6 - 10.2 K/uL   Lymph,  poc 2.5  0.6 - 3.4   POC LYMPH PERCENT 41.8  10 - 50 %L   MID (cbc) 0.5  0 - 0.9   POC MID % 8.2  0 - 12 %M   POC Granulocyte 2.9  2 - 6.9   Granulocyte percent 50.0  37 - 80 %G   RBC 4.70  4.04 - 5.48 M/uL   Hemoglobin 12.8  12.2 - 16.2 g/dL  HCT, POC 41.9  37.7 - 47.9 %   MCV 89.1  80 - 97 fL   MCH, POC 27.2  27 - 31.2 pg   MCHC 30.5 (*) 31.8 - 35.4 g/dL   RDW, POC 16.1     Platelet Count, POC 397  142 - 424 K/uL   MPV 9.4  0 - 99.8 fL     EKG/XRAY:   Primary read interpreted by Dr. Conley Rolls at [facility]. No fx/dislocation   ASSESSMENT/PLAN: Encounter Diagnoses  Name Primary?  . Pain in joint, ankle and foot, left Yes  . Contusion    Take mobic that she already has from ankle sprain RICE Cam walker given F/u in 1 week if no improvement Gross sideeffects, risk and benefits, and alternatives of medications d/w patient. Patient is aware that all medications have potential sideeffects and we are unable to predict every sideeffect or drug-drug interaction that may occur.  LE, THAO PHUONG, DO 08/24/2013 11:33 AM

## 2013-08-24 NOTE — Patient Instructions (Signed)
Diabetes and Exercise Regular exercise is important and can help:   Control blood glucose (sugar).  Decrease blood pressure.    Control blood lipids (cholesterol, triglycerides).  Improve overall health. BENEFITS FROM EXERCISE  Improved fitness.  Improved flexibility.  Improved endurance.  Increased bone density.  Weight control.  Increased muscle strength.  Decreased body fat.  Improvement of the body's use of insulin, a hormone.  Increased insulin sensitivity.  Reduction of insulin needs.  Reduced stress and tension.  Helps you feel better. People with diabetes who add exercise to their lifestyle gain additional benefits, including:  Weight loss.  Reduced appetite.  Improvement of the body's use of blood glucose.  Decreased risk factors for heart disease:  Lowering of cholesterol and triglycerides.  Raising the level of good cholesterol (high-density lipoproteins, HDL).  Lowering blood sugar.  Decreased blood pressure. TYPE 1 DIABETES AND EXERCISE  Exercise will usually lower your blood glucose.  If blood glucose is greater than 240 mg/dl, check urine ketones. If ketones are present, do not exercise.  Location of the insulin injection sites may need to be adjusted with exercise. Avoid injecting insulin into areas of the body that will be exercised. For example, avoid injecting insulin into:  The arms when playing tennis.  The legs when jogging. For more information, discuss this with your caregiver.  Keep a record of:  Food intake.  Type and amount of exercise.  Expected peak times of insulin action.  Blood glucose levels. Do this before, during, and after exercise. Review your records with your caregiver. This will help you to develop guidelines for adjusting food intake and insulin amounts.  TYPE 2 DIABETES AND EXERCISE  Regular physical activity can help control blood glucose.  Exercise is important because it may:  Increase the  body's sensitivity to insulin.  Improve blood glucose control.  Exercise reduces the risk of heart disease. It decreases serum cholesterol and triglycerides. It also lowers blood pressure.  Those who take insulin or oral hypoglycemic agents should watch for signs of hypoglycemia. These signs include dizziness, shaking, sweating, chills, and confusion.  Body water is lost during exercise. It must be replaced. This will help to avoid loss of body fluids (dehydration) or heat stroke. Be sure to talk to your caregiver before starting an exercise program to make sure it is safe for you. Remember, any activity is better than none.  Document Released: 02/18/2004 Document Revised: 02/20/2012 Document Reviewed: 06/04/2009 [facility] Patient Information 2014 [place], [place]. DASH Diet The DASH diet stands for "Dietary Approaches to Stop Hypertension." It is a healthy eating plan that has been shown to reduce high blood pressure (hypertension) in as little as 14 days, while also possibly providing other significant health benefits. These other health benefits include reducing the risk of breast cancer after menopause and reducing the risk of type 2 diabetes, heart disease, colon cancer, and stroke. Health benefits also include weight loss and slowing kidney failure in patients with chronic kidney disease.  DIET GUIDELINES  Limit salt (sodium). Your diet should contain less than 1500 mg of sodium daily.  Limit refined or processed carbohydrates. Your diet should include mostly whole grains. Desserts and added sugars should be used sparingly.  Include small amounts of heart-healthy fats. These types of fats include nuts, oils, and tub margarine. Limit saturated and trans fats. These fats have been shown to be harmful in the body. CHOOSING FOODS  The following food groups are based on a 2000 calorie diet. See your  Registered Dietitian for individual calorie needs. Grains and Grain Products (6 to 8 servings  daily)  Eat More Often: Whole-wheat bread, brown rice, whole-grain or wheat pasta, quinoa, popcorn without added fat or salt (air popped).  Eat Less Often: White bread, white pasta, white rice, cornbread. Vegetables (4 to 5 servings daily)  Eat More Often: Fresh, frozen, and canned vegetables. Vegetables may be raw, steamed, roasted, or grilled with a minimal amount of fat.  Eat Less Often/Avoid: Creamed or fried vegetables. Vegetables in a cheese sauce. Fruit (4 to 5 servings daily)  Eat More Often: All fresh, canned (in natural juice), or frozen fruits. Dried fruits without added sugar. One hundred percent fruit juice ( cup [237 mL] daily).  Eat Less Often: Dried fruits with added sugar. Canned fruit in light or heavy syrup. [employer], Fish, and Poultry (2 servings or less daily. One serving is 3 to 4 oz [85-114 g]).  Eat More Often: Ninety percent or leaner ground beef, tenderloin, sirloin. Round cuts of beef, chicken breast, [place] breast. All fish. Grill, bake, or broil your meat. Nothing should be fried.  Eat Less Often/Avoid: Fatty cuts of meat, [place], or chicken leg, thigh, or wing. Fried cuts of meat or fish. Dairy (2 to 3 servings)  Eat More Often: Low-fat or fat-free milk, low-fat plain or light yogurt, reduced-fat or part-skim cheese.  Eat Less Often/Avoid: Milk (whole, 2%).Whole milk yogurt. Full-fat cheeses. Nuts, Seeds, and Legumes (4 to 5 servings per week)  Eat More Often: All without added salt.  Eat Less Often/Avoid: Salted nuts and seeds, canned beans with added salt. Fats and Sweets (limited)  Eat More Often: Vegetable oils, tub margarines without trans fats, sugar-free gelatin. Mayonnaise and salad dressings.  Eat Less Often/Avoid: Coconut oils, palm oils, butter, stick margarine, cream, half and half, cookies, candy, pie. FOR MORE INFORMATION The Dash Diet Eating Plan: www.dashdiet.org Document Released: 11/17/2011 Document Revised: 02/20/2012 Document  Reviewed: 11/17/2011 [facility] Patient Information 2014 [place], [place].

## 2014-11-22 ENCOUNTER — Ambulatory Visit (INDEPENDENT_AMBULATORY_CARE_PROVIDER_SITE_OTHER): Payer: 59 | Admitting: Physician Assistant

## 2014-11-22 VITALS — BP 142/78 | HR 91 | Temp 97.6°F | Resp 18 | Ht 60.5 in | Wt 174.0 lb

## 2014-11-22 DIAGNOSIS — N898 Other specified noninflammatory disorders of vagina: Secondary | ICD-10-CM

## 2014-11-22 DIAGNOSIS — B3731 Acute candidiasis of vulva and vagina: Secondary | ICD-10-CM

## 2014-11-22 DIAGNOSIS — L309 Dermatitis, unspecified: Secondary | ICD-10-CM

## 2014-11-22 DIAGNOSIS — R21 Rash and other nonspecific skin eruption: Secondary | ICD-10-CM

## 2014-11-22 DIAGNOSIS — R059 Cough, unspecified: Secondary | ICD-10-CM

## 2014-11-22 DIAGNOSIS — L298 Other pruritus: Secondary | ICD-10-CM

## 2014-11-22 DIAGNOSIS — B373 Candidiasis of vulva and vagina: Secondary | ICD-10-CM

## 2014-11-22 DIAGNOSIS — K219 Gastro-esophageal reflux disease without esophagitis: Secondary | ICD-10-CM

## 2014-11-22 DIAGNOSIS — R05 Cough: Secondary | ICD-10-CM

## 2014-11-22 LAB — POCT WET PREP WITH KOH
KOH Prep POC: POSITIVE
RBC WET PREP PER HPF POC: NEGATIVE
Trichomonas, UA: NEGATIVE
Yeast Wet Prep HPF POC: NEGATIVE

## 2014-11-22 LAB — POCT SKIN KOH: SKIN KOH, POC: NEGATIVE

## 2014-11-22 MED ORDER — FLUCONAZOLE 150 MG PO TABS: 150.0000 mg | ORAL_TABLET | Freq: Once | ORAL | Status: DC

## 2014-11-22 MED ORDER — HYDROCOD POLST-CHLORPHEN POLST 10-8 MG/5ML PO LQCR: 5.0000 mL | Freq: Two times a day (BID) | ORAL | Status: AC | PRN

## 2014-11-22 MED ORDER — HYDROXYZINE HCL 25 MG PO TABS: 12.5000 mg | ORAL_TABLET | Freq: Three times a day (TID) | ORAL | Status: AC | PRN

## 2014-11-22 MED ORDER — PREDNISONE 20 MG PO TABS: ORAL_TABLET | ORAL | Status: AC

## 2014-11-22 MED ORDER — LANSOPRAZOLE 30 MG PO CPDR: 30.0000 mg | DELAYED_RELEASE_CAPSULE | Freq: Every day | ORAL | Status: DC

## 2014-11-22 MED ORDER — TRIAMCINOLONE ACETONIDE 0.1 % EX CREA: 1.0000 "application " | TOPICAL_CREAM | Freq: Two times a day (BID) | CUTANEOUS | Status: AC

## 2014-11-22 NOTE — Patient Instructions (Signed)
Eczema Eczema, also called atopic dermatitis, is a skin disorder that causes inflammation of the skin. It causes a red rash and dry, scaly skin. The skin becomes very itchy. Eczema is generally worse during the cooler winter months and often improves with the warmth of summer. Eczema usually starts showing signs in infancy. Some children outgrow eczema, but it may last through adulthood.  CAUSES  The exact cause of eczema is not known, but it appears to run in families. People with eczema often have a family history of eczema, allergies, asthma, or hay fever. Eczema is not contagious. Flare-ups of the condition may be caused by:   Contact with something you are sensitive or allergic to.   Stress. SIGNS AND SYMPTOMS  Dry, scaly skin.   Red, itchy rash.   Itchiness. This may occur before the skin rash and may be very intense.  DIAGNOSIS  The diagnosis of eczema is usually made based on symptoms and medical history. TREATMENT  Eczema cannot be cured, but symptoms usually can be controlled with treatment and other strategies. A treatment plan might include:  Controlling the itching and scratching.   Use over-the-counter antihistamines as directed for itching. This is especially useful at night when the itching tends to be worse.   Use over-the-counter steroid creams as directed for itching.   Avoid scratching. Scratching makes the rash and itching worse. It may also result in a skin infection (impetigo) due to a break in the skin caused by scratching.   Keeping the skin well moisturized with creams every day. This will seal in moisture and help prevent dryness. Lotions that contain alcohol and water should be avoided because they can dry the skin.   Limiting exposure to things that you are sensitive or allergic to (allergens).   Recognizing situations that cause stress.   Developing a plan to manage stress.  HOME CARE INSTRUCTIONS   Only take over-the-counter or  prescription medicines as directed by your health care provider.   Do not use anything on the skin without checking with your health care provider.   Keep baths or showers short (5 minutes) in warm (not hot) water. Use mild cleansers for bathing. These should be unscented. You may add nonperfumed bath oil to the bath water. It is best to avoid soap and bubble bath.   Immediately after a bath or shower, when the skin is still damp, apply a moisturizing ointment to the entire body. This ointment should be a petroleum ointment. This will seal in moisture and help prevent dryness. The thicker the ointment, the better. These should be unscented.   Keep fingernails cut short. Children with eczema may need to wear soft gloves or mittens at night after applying an ointment.   Dress in clothes made of cotton or cotton blends. Dress lightly, because heat increases itching.   A child with eczema should stay away from anyone with fever blisters or cold sores. The virus that causes fever blisters (herpes simplex) can cause a serious skin infection in children with eczema. SEEK MEDICAL CARE IF:   Your itching interferes with sleep.   Your rash gets worse or is not better within 1 week after starting treatment.   You see pus or soft yellow scabs in the rash area.   You have a fever.   You have a rash flare-up after contact with someone who has fever blisters.  Document Released: 11/25/2000 Document Revised: 09/18/2013 Document Reviewed: 07/01/2013 [employer] Patient Information 2015 [employer]. This information   is not intended to replace advice given to you by your health care provider. Make sure you discuss any questions you have with your health care provider.  

## 2014-11-22 NOTE — Progress Notes (Signed)
[facility] [address] [place] [address] [phone]  Date:  11/22/2014   Name:  Patricia Murphy   DOB:  04-16-1956   MRN:  [id number]  PCP:  PROVIDER NOT IN SYSTEM    Chief Complaint: Rash; Insect Bite; and Immunizations   History of Present Illness:  Patricia Murphy is a 58 y.o. very pleasant female patient who presents with the following:  7 days of pruritic rash.  She states that the rash began near her arms, and progressively spread along her body down her arms, over her back, and vaginal area.  She believes that when she scratches, the rash spreads.  She states that for 7 days, she has noticed increased coughing and sob.  She denies fever, dizziness, nausea, vomiting, or malaise.  She has used the ventolin HFA, which helped.  She has a hx of eczema, but states that she keeps this well-controlled with aveeno moisturizers and non-hot oatmeal baths.  She states that the day before the outbreak, she had washed her clothes at the laundromat--normally washes clothes at her home.  Husband may have noticed abdominal rash similar to hers, but resolved within days. She also notes a fish allergy, and states that 1.5 weeks ago, she had consumed it, but did not have a rapid response to that.  She has tried hydrocortisone which has helped little.  The benadryl at night has eased the pruritus, but by day, the itching returns.  She has also tried oatmeal baths and aveeno, which has made the rash burn.   She has also noticed a musty vaginal odor.  There is no abdominal pain, change in vaginal discharge, or bleeding.  This has began 7 days ago as well.  She is sexually active with her husband.   She is also experiencing heart burn with laying down.  She has a hx of heartburn, but ran out of her prevacid and assumed that she was finished with the prescription.    Hx of SOB has been worked up, including bx for sarcoidosis, which proved negative.  Patient Active Problem List   Diagnosis Date Noted  . GERD (gastroesophageal reflux disease) 02/01/2013  . RUQ pain 02/01/2013    Past Medical History  Diagnosis Date  . Allergy   . Hypertension   . Diabetes mellitus without complication     due to 6 months of steroids, was never high prior to steroid use for retinitis    History reviewed. No pertinent past surgical history.  History  Substance Use Topics  . Smoking status: Never Smoker   . Smokeless tobacco: Not on file  . Alcohol Use: No    Family History  Problem Relation Age of Onset  . Heart disease Mother   . Heart disease Father   . Heart disease Maternal Grandmother     Allergies  Allergen Reactions  . Ivp Dye [Iodinated Diagnostic Agents] Nausea And Vomiting  . Penicillins     Medication list has been reviewed and updated.  Current Outpatient Prescriptions on File Prior to Visit  Medication Sig Dispense Refill  . albuterol (PROVENTIL HFA;VENTOLIN HFA) 108 (90 BASE) MCG/ACT inhaler Inhale 2 puffs into the lungs every 4 (four) hours as needed for wheezing (cough, shortness of breath or wheezing.). 1 Inhaler 12  . benzonatate (TESSALON) 100 MG capsule Take 1-2 capsules (100-200 mg total) by mouth 3 (three) times daily as needed for cough. (Patient not taking: Reported on 11/22/2014) 60 capsule 3  .  bismuth-metronidazole-tetracycline (PLYERA) 140-125-125 MG per capsule Take 3 capsules by mouth 4 (four) times daily -  before meals and at bedtime.    . chlorpheniramine-HYDROcodone (TUSSIONEX PENNKINETIC ER) 10-8 MG/5ML LQCR Take 5 mLs by mouth every 12 (twelve) hours as needed (cough). (Patient not taking: Reported on 11/22/2014) 240 mL 0  . cholecalciferol (VITAMIN D) 1000 UNITS tablet Take 500 Units by mouth daily.    . fluticasone (FLONASE) 50 MCG/ACT nasal spray Place 2 sprays into the nose daily. (Patient not taking: Reported on 11/22/2014) 16 g 6  . lansoprazole (PREVACID) 30 MG capsule Take 1 capsule (30 mg total) by mouth daily. (Patient  not taking: Reported on 11/22/2014) 30 capsule 2  . loratadine-pseudoephedrine (CLARITIN-D 24-HOUR) 10-240 MG per 24 hr tablet Take 1 tablet by mouth daily.    . meloxicam (MOBIC) 15 MG tablet Take 1 tablet (15 mg total) by mouth daily. (Patient not taking: Reported on 11/22/2014) 30 tablet 0  . Metronid-Tetracyc-Bis Subsal MISC As directed on package (Patient not taking: Reported on 11/22/2014) 224 each 0  . Multiple Vitamin (MULTIVITAMIN) tablet Take 1 tablet by mouth daily.    Marland Kitchen. omeprazole (PRILOSEC) 40 MG capsule Take 1 capsule (40 mg total) by mouth daily. (Patient not taking: Reported on 11/22/2014) 30 capsule 5  . telmisartan (MICARDIS) 20 MG tablet Take 20 mg by mouth daily.     No current facility-administered medications on file prior to visit.     Review of Systems: ROS otherwise unremarkable unless listed above.    Physical Examination: Filed Vitals:   11/22/14 1038  BP: 142/78  Pulse: 91  Temp: 97.6 F (36.4 C)  Resp: 18   Filed Vitals:   11/22/14 1038  Height: 5' 0.5" (1.537 m)  Weight: 174 lb (78.926 kg)   Body mass index is 33.41 kg/(m^2). Ideal Body Weight: Weight in (lb) to have BMI = 25: 129.9  Physical Exam  Constitutional: She is oriented to person, place, and time. She appears well-developed and well-nourished.  HENT:  Head: Normocephalic and atraumatic.  Eyes: Conjunctivae are normal. Pupils are equal, round, and reactive to light.  Neck: Normal range of motion.  Cardiovascular: Normal rate, regular rhythm and normal heart sounds.  Exam reveals no gallop and no friction rub.   No murmur heard. Pulmonary/Chest: Effort normal and breath sounds normal. No respiratory distress. She has no wheezes.  Genitourinary: Vaginal discharge found.  Neurological: She is alert and oriented to person, place, and time.  Skin: Skin is warm and dry.  Scaly epidermis, with hyperpigmentation diffuse at trunk, lower back, and extremities.  Lichenification  posterior  cervical.  Flexors are hyperpigmented shiny and lichen as well.   Vaginal area has this hyperpigmented papules.  No excoriation detected.      Psychiatric: She has a normal mood and affect. Her behavior is normal. Judgment and thought content normal.   . Results for orders placed or performed in visit on 11/22/14  POCT Wet Prep with KOH  Result Value Ref Range   Trichomonas, UA Negative    Clue Cells Wet Prep HPF POC 4-6    Epithelial Wet Prep HPF POC 2-6    Yeast Wet Prep HPF POC neg    Bacteria Wet Prep HPF POC 2+    RBC Wet Prep HPF POC neg    WBC Wet Prep HPF POC 1-6    KOH Prep POC Positive   POCT Skin KOH  Result Value Ref Range   Skin KOH, POC Negative  Assessment and Plan: 58 year old female is here today for chief complaint of itching along body and vaginal area.   Diff dx includes eczema exacerbation d/t irrititant contact, fungal infection-- candidiasis.  This rash appears more chronic (lichen skin).  An irritant exposure, and perhaps new stressors, (patient fired from job), has exacerbated her eczema, so we will treat that.  We will also treat her for separate yeast infection.  Cough may be more recognizable with patient discomfort.  This is likely due to reflux.   -Vistaril for pruritus -Prednisone taper -Kenalog for 2 weeks -Refill Prevacid -Follow up in 1 month  Eczema POCT Skin KOH, hydrOXYzine (ATARAX/VISTARIL) 25 MG tablet, predniSONE (DELTASONE) 20 MG tablet, triamcinolone cream (KENALOG) 0.1 %  Rash and nonspecific skin eruption  POCT Skin KOH, hydrOXYzine (ATARAX/VISTARIL) 25 MG tablet, predniSONE (DELTASONE) 20 MG tablet, triamcinolone cream (KENALOG) 0.1 %  Vaginal itching  POCT Wet Prep with KOH, fluconazole (DIFLUCAN) 150 MG tablet  Yeast vaginitis  fluconazole (DIFLUCAN) 150 MG tablet  Gastroesophageal reflux disease without esophagitis  lansoprazole (PREVACID) 30 MG capsule  Coughing  chlorpheniramine-HYDROcodone (TUSSIONEX PENNKINETIC ER)  10-8 MG/5ML Mare FerrariLQCR  Trena PlattStephanie Milca Sytsma, PA-C [facility] 12/14/20158:51 AM

## 2014-11-25 ENCOUNTER — Telehealth: Payer: Self-pay

## 2014-11-25 DIAGNOSIS — K219 Gastro-esophageal reflux disease without esophagitis: Secondary | ICD-10-CM

## 2014-11-25 MED ORDER — PANTOPRAZOLE SODIUM 40 MG PO TBEC: 40.0000 mg | DELAYED_RELEASE_TABLET | Freq: Every day | ORAL | Status: DC

## 2014-11-25 NOTE — Telephone Encounter (Signed)
PA was needed for lansoprazole, but when I called was told it is a plan exclusion. The only PPIs covered are omeprazole and pantoprazole. Pt has tried omeprazole in the past. Do you want to Rx pantoprazole?

## 2014-11-26 NOTE — Telephone Encounter (Signed)
Ordered the protonix 40mg  in stead of the lansoprazole if that does not fit the plan.  Please contact patient that the order was sent to the pharmacy.

## 2014-11-27 NOTE — Telephone Encounter (Signed)
Called and informed pt rx has been sent in.

## 2015-02-23 ENCOUNTER — Emergency Department (HOSPITAL_COMMUNITY): Payer: No Typology Code available for payment source

## 2015-02-23 ENCOUNTER — Encounter (HOSPITAL_COMMUNITY): Payer: Self-pay | Admitting: Emergency Medicine

## 2015-02-23 ENCOUNTER — Emergency Department (HOSPITAL_COMMUNITY)
Admission: EM | Admit: 2015-02-23 | Discharge: 2015-02-23 | Disposition: A | Discharge status: Home / Self Care | Payer: No Typology Code available for payment source | Attending: Emergency Medicine | Admitting: Emergency Medicine

## 2015-02-23 DIAGNOSIS — S0093XA Contusion of unspecified part of head, initial encounter: Secondary | ICD-10-CM

## 2015-02-23 DIAGNOSIS — S0990XA Unspecified injury of head, initial encounter: Secondary | ICD-10-CM | POA: Diagnosis present

## 2015-02-23 DIAGNOSIS — E119 Type 2 diabetes mellitus without complications: Secondary | ICD-10-CM | POA: Insufficient documentation

## 2015-02-23 DIAGNOSIS — Y998 Other external cause status: Secondary | ICD-10-CM | POA: Insufficient documentation

## 2015-02-23 DIAGNOSIS — Z79899 Other long term (current) drug therapy: Secondary | ICD-10-CM | POA: Diagnosis not present

## 2015-02-23 DIAGNOSIS — I10 Essential (primary) hypertension: Secondary | ICD-10-CM | POA: Diagnosis not present

## 2015-02-23 DIAGNOSIS — S3992XA Unspecified injury of lower back, initial encounter: Secondary | ICD-10-CM | POA: Diagnosis not present

## 2015-02-23 DIAGNOSIS — S24109A Unspecified injury at unspecified level of thoracic spinal cord, initial encounter: Secondary | ICD-10-CM | POA: Insufficient documentation

## 2015-02-23 DIAGNOSIS — Y92410 Unspecified street and highway as the place of occurrence of the external cause: Secondary | ICD-10-CM | POA: Insufficient documentation

## 2015-02-23 DIAGNOSIS — Z880 Allergy status to penicillin: Secondary | ICD-10-CM | POA: Diagnosis not present

## 2015-02-23 DIAGNOSIS — Y9389 Activity, other specified: Secondary | ICD-10-CM | POA: Insufficient documentation

## 2015-02-23 MED ORDER — ACETAMINOPHEN 325 MG PO TABS
650.0000 mg | ORAL_TABLET | Freq: Once | ORAL | Status: AC
  Administered 2015-02-23: 650 mg via ORAL
  Filled 2015-02-23: qty 2

## 2015-02-23 MED ORDER — ONDANSETRON 4 MG PO TBDP
4.0000 mg | ORAL_TABLET | Freq: Once | ORAL | Status: AC
  Administered 2015-02-23: 4 mg via ORAL
  Filled 2015-02-23: qty 1

## 2015-02-23 MED ORDER — CYCLOBENZAPRINE HCL 5 MG PO TABS: 10.0000 mg | ORAL_TABLET | Freq: Two times a day (BID) | ORAL | Status: DC | PRN

## 2015-02-23 MED ORDER — ACETAMINOPHEN 500 MG PO TABS: 500.0000 mg | ORAL_TABLET | Freq: Four times a day (QID) | ORAL | Status: DC | PRN

## 2015-02-23 MED ORDER — CYCLOBENZAPRINE HCL 10 MG PO TABS
5.0000 mg | ORAL_TABLET | Freq: Once | ORAL | Status: AC
  Administered 2015-02-23: 5 mg via ORAL
  Filled 2015-02-23: qty 1
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
## 2015-02-23 NOTE — Discharge Instructions (Signed)
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
## 2015-03-01 ENCOUNTER — Ambulatory Visit (INDEPENDENT_AMBULATORY_CARE_PROVIDER_SITE_OTHER): Payer: 59 | Admitting: Physician Assistant

## 2015-03-01 VITALS — BP 138/72 | HR 85 | Temp 97.5°F | Resp 14 | Ht 61.0 in | Wt 178.4 lb

## 2015-03-01 DIAGNOSIS — Z09 Encounter for follow-up examination after completed treatment for conditions other than malignant neoplasm: Secondary | ICD-10-CM

## 2015-03-01 DIAGNOSIS — R053 Chronic cough: Secondary | ICD-10-CM

## 2015-03-01 DIAGNOSIS — R05 Cough: Secondary | ICD-10-CM

## 2015-03-01 DIAGNOSIS — K219 Gastro-esophageal reflux disease without esophagitis: Secondary | ICD-10-CM | POA: Diagnosis not present

## 2015-03-01 LAB — POCT CBC
Granulocyte percent: 56.9 %G (ref 37–80)
HCT, POC: 41 % (ref 37.7–47.9)
Hemoglobin: 12.8 g/dL (ref 12.2–16.2)
LYMPH, POC: 1.8 (ref 0.6–3.4)
MCH, POC: 28.2 pg (ref 27–31.2)
MCHC: 31.1 g/dL — AB (ref 31.8–35.4)
MCV: 90.6 fL (ref 80–97)
MID (cbc): 0.4 (ref 0–0.9)
MPV: 8.4 fL (ref 0–99.8)
POC GRANULOCYTE: 2.9 (ref 2–6.9)
POC LYMPH PERCENT: 35.8 %L (ref 10–50)
POC MID %: 7.3 %M (ref 0–12)
Platelet Count, POC: 265 10*3/uL (ref 142–424)
RBC: 4.53 M/uL (ref 4.04–5.48)
RDW, POC: 14.6 %
WBC: 5.1 10*3/uL (ref 4.6–10.2)

## 2015-03-01 LAB — POCT URINALYSIS DIPSTICK
BILIRUBIN UA: NEGATIVE
Glucose, UA: NEGATIVE
KETONES UA: NEGATIVE
NITRITE UA: NEGATIVE
Protein, UA: NEGATIVE
RBC UA: NEGATIVE
SPEC GRAV UA: 1.015
UROBILINOGEN UA: 1
pH, UA: 5.5

## 2015-03-01 LAB — POCT UA - MICROSCOPIC ONLY
CASTS, UR, LPF, POC: NEGATIVE
Crystals, Ur, HPF, POC: NEGATIVE
MUCUS UA: NEGATIVE
RBC, urine, microscopic: NEGATIVE
YEAST UA: NEGATIVE

## 2015-03-01 MED ORDER — RANITIDINE HCL 150 MG PO TABS: 150.0000 mg | ORAL_TABLET | Freq: Two times a day (BID) | ORAL | Status: DC
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
## 2015-03-02 LAB — COMPREHENSIVE METABOLIC PANEL
ALT: 16 U/L (ref 0–35)
AST: 19 U/L (ref 0–37)
Albumin: 4.4 g/dL (ref 3.5–5.2)
Alkaline Phosphatase: 104 U/L (ref 39–117)
BILIRUBIN TOTAL: 0.4 mg/dL (ref 0.2–1.2)
BUN: 7 mg/dL (ref 6–23)
CO2: 27 meq/L (ref 19–32)
CREATININE: 0.72 mg/dL (ref 0.50–1.10)
Calcium: 9.5 mg/dL (ref 8.4–10.5)
Chloride: 101 mEq/L (ref 96–112)
GLUCOSE: 93 mg/dL (ref 70–99)
Potassium: 3.8 mEq/L (ref 3.5–5.3)
SODIUM: 138 meq/L (ref 135–145)
Total Protein: 7.4 g/dL (ref 6.0–8.3)

## 2015-03-02 LAB — TSH: TSH: 1.156 u[IU]/mL (ref 0.350–4.500)

## 2015-03-09 ENCOUNTER — Telehealth: Payer: Self-pay

## 2015-03-09 NOTE — Telephone Encounter (Signed)
Insurance will not cover the reflux meds,  However they will cover omprazole.   [facility] - [address]   [phone]

## 2015-03-09 NOTE — Telephone Encounter (Signed)
Operator advised me that Pt is waiting at the pharm now. Patricia NeedleMichael, do you want to Rx omeprazole?

## 2015-03-09 NOTE — Telephone Encounter (Signed)
I would prefer she start with ranitidine.  It is over the counter and should not be too expensive.  I would have prescribed a PPI if her symptoms were more frequent and severe. Deliah BostonMichael Clark, MS, PA-C   6:43 PM, 03/09/2015

## 2015-03-11 NOTE — Telephone Encounter (Signed)
Tried to call pt but recording said that "wireless customer was not available at the time" and would not allow me to LM. Called pharmacy and explained to pharmacist and she reported that the pt paid OOP for the ranitidine, it was only $27.

## 2016-01-19 ENCOUNTER — Other Ambulatory Visit: Payer: Self-pay | Admitting: Family Medicine

## 2016-01-19 ENCOUNTER — Other Ambulatory Visit (INDEPENDENT_AMBULATORY_CARE_PROVIDER_SITE_OTHER): Payer: 59 | Admitting: Family Medicine

## 2016-01-19 DIAGNOSIS — I10 Essential (primary) hypertension: Secondary | ICD-10-CM

## 2016-01-19 DIAGNOSIS — Z114 Encounter for screening for human immunodeficiency virus [HIV]: Secondary | ICD-10-CM

## 2016-01-19 DIAGNOSIS — Z0000 Encounter for general adult medical examination without abnormal findings: Secondary | ICD-10-CM

## 2016-01-19 DIAGNOSIS — R739 Hyperglycemia, unspecified: Secondary | ICD-10-CM

## 2016-01-19 DIAGNOSIS — K219 Gastro-esophageal reflux disease without esophagitis: Secondary | ICD-10-CM | POA: Diagnosis not present

## 2016-01-19 DIAGNOSIS — Z1159 Encounter for screening for other viral diseases: Secondary | ICD-10-CM

## 2016-01-19 DIAGNOSIS — H469 Unspecified optic neuritis: Secondary | ICD-10-CM

## 2016-01-19 LAB — CBC WITH DIFFERENTIAL/PLATELET
BASOS ABS: 0.1 10*3/uL (ref 0.0–0.1)
Basophils Relative: 1 % (ref 0–1)
Eosinophils Absolute: 0.2 10*3/uL (ref 0.0–0.7)
Eosinophils Relative: 4 % (ref 0–5)
HEMATOCRIT: 35 % — AB (ref 36.0–46.0)
HEMOGLOBIN: 11.5 g/dL — AB (ref 12.0–15.0)
LYMPHS ABS: 2.1 10*3/uL (ref 0.7–4.0)
LYMPHS PCT: 41 % (ref 12–46)
MCH: 28.7 pg (ref 26.0–34.0)
MCHC: 32.9 g/dL (ref 30.0–36.0)
MCV: 87.3 fL (ref 78.0–100.0)
MONO ABS: 0.4 10*3/uL (ref 0.1–1.0)
MPV: 10.8 fL (ref 8.6–12.4)
Monocytes Relative: 8 % (ref 3–12)
NEUTROS ABS: 2.3 10*3/uL (ref 1.7–7.7)
Neutrophils Relative %: 46 % (ref 43–77)
Platelets: 266 10*3/uL (ref 150–400)
RBC: 4.01 MIL/uL (ref 3.87–5.11)
RDW: 14.7 % (ref 11.5–15.5)
WBC: 5.1 10*3/uL (ref 4.0–10.5)

## 2016-01-19 LAB — TSH: TSH: 2.67 mIU/L

## 2016-01-20 LAB — COMPREHENSIVE METABOLIC PANEL
ALBUMIN: 3.8 g/dL (ref 3.6–5.1)
ALK PHOS: 89 U/L (ref 33–130)
ALT: 11 U/L (ref 6–29)
AST: 15 U/L (ref 10–35)
BUN: 10 mg/dL (ref 7–25)
CALCIUM: 8.8 mg/dL (ref 8.6–10.4)
CO2: 26 mmol/L (ref 20–31)
Chloride: 106 mmol/L (ref 98–110)
Creat: 0.81 mg/dL (ref 0.50–0.99)
Glucose, Bld: 111 mg/dL — ABNORMAL HIGH (ref 65–99)
POTASSIUM: 4 mmol/L (ref 3.5–5.3)
Sodium: 143 mmol/L (ref 135–146)
TOTAL PROTEIN: 6.5 g/dL (ref 6.1–8.1)
Total Bilirubin: 0.4 mg/dL (ref 0.2–1.2)

## 2016-01-20 LAB — LIPID PANEL
CHOLESTEROL: 205 mg/dL — AB (ref 125–200)
HDL: 71 mg/dL (ref >=46)
LDL CALC: 121 mg/dL (ref <130)
TRIGLYCERIDES: 64 mg/dL (ref <150)
Total CHOL/HDL Ratio: 2.9 Ratio (ref <=5.0)
VLDL: 13 mg/dL (ref <30)

## 2016-01-20 LAB — HEPATITIS C ANTIBODY: HCV AB: NEGATIVE

## 2016-01-20 LAB — HIV ANTIBODY (ROUTINE TESTING W REFLEX): HIV 1&2 Ab, 4th Generation: NONREACTIVE

## 2016-01-20 LAB — HEMOGLOBIN A1C
HEMOGLOBIN A1C: 6.3 % — AB (ref <5.7)
MEAN PLASMA GLUCOSE: 134 mg/dL — AB (ref <117)

## 2016-01-21 ENCOUNTER — Encounter: Payer: Self-pay | Admitting: Family Medicine

## 2016-01-27 ENCOUNTER — Encounter: Payer: 59 | Admitting: Physician Assistant

## 2016-02-01 ENCOUNTER — Ambulatory Visit (INDEPENDENT_AMBULATORY_CARE_PROVIDER_SITE_OTHER): Payer: 59 | Admitting: Family Medicine

## 2016-02-01 VITALS — BP 124/76 | HR 91 | Temp 97.9°F | Resp 18 | Ht 61.5 in | Wt 193.0 lb

## 2016-02-01 DIAGNOSIS — B9689 Other specified bacterial agents as the cause of diseases classified elsewhere: Secondary | ICD-10-CM

## 2016-02-01 DIAGNOSIS — K219 Gastro-esophageal reflux disease without esophagitis: Secondary | ICD-10-CM | POA: Diagnosis not present

## 2016-02-01 DIAGNOSIS — N898 Other specified noninflammatory disorders of vagina: Secondary | ICD-10-CM

## 2016-02-01 DIAGNOSIS — D649 Anemia, unspecified: Secondary | ICD-10-CM | POA: Diagnosis not present

## 2016-02-01 DIAGNOSIS — R21 Rash and other nonspecific skin eruption: Secondary | ICD-10-CM

## 2016-02-01 DIAGNOSIS — R05 Cough: Secondary | ICD-10-CM | POA: Diagnosis not present

## 2016-02-01 DIAGNOSIS — R053 Chronic cough: Secondary | ICD-10-CM

## 2016-02-01 DIAGNOSIS — N760 Acute vaginitis: Secondary | ICD-10-CM

## 2016-02-01 DIAGNOSIS — R7303 Prediabetes: Secondary | ICD-10-CM | POA: Diagnosis not present

## 2016-02-01 DIAGNOSIS — A499 Bacterial infection, unspecified: Secondary | ICD-10-CM | POA: Diagnosis not present

## 2016-02-01 DIAGNOSIS — L298 Other pruritus: Secondary | ICD-10-CM

## 2016-02-01 LAB — POCT WET PREP WITH KOH
KOH Prep POC: NEGATIVE
RBC WET PREP PER HPF POC: NEGATIVE
Trichomonas, UA: NEGATIVE
WBC Wet Prep HPF POC: NEGATIVE
Yeast Wet Prep HPF POC: NEGATIVE

## 2016-02-01 MED ORDER — PREDNISONE 20 MG PO TABS: ORAL_TABLET | ORAL | Status: DC

## 2016-02-01 MED ORDER — HYDROXYZINE HCL 25 MG PO TABS: 12.5000 mg | ORAL_TABLET | Freq: Three times a day (TID) | ORAL | Status: DC | PRN

## 2016-02-01 MED ORDER — METRONIDAZOLE 500 MG PO TABS: 500.0000 mg | ORAL_TABLET | Freq: Two times a day (BID) | ORAL | Status: DC

## 2016-02-01 MED ORDER — TRIAMCINOLONE ACETONIDE 0.5 % EX OINT: 1.0000 "application " | TOPICAL_OINTMENT | Freq: Two times a day (BID) | CUTANEOUS | Status: DC

## 2016-02-01 MED ORDER — HYDROCORTISONE 1 % EX OINT: 1.0000 "application " | TOPICAL_OINTMENT | Freq: Two times a day (BID) | CUTANEOUS | Status: DC

## 2016-02-01 NOTE — Progress Notes (Signed)
[facility] [address], [place] [address] [phone]  Date:  02/01/2016   Name:  Patricia Murphy   DOB:  1955/12/25   MRN:  [id number]  PCP:  PROVIDER NOT IN SYSTEM    Chief Complaint: Rash; Vaginal Discharge; Vaginal Itching; and Abdominal Pain   History of Present Illness:  Patricia Murphy is a 60 y.o. very pleasant female patient who presents with the following: Rash:  - Generalized - Hx of eczema, poorly controlled.  - Began 1 week ago.  Started in upper arms,  anterior chest, then arms.  - Lives with husband, no rash.  - No new medications prior to rash.  No illness - Lots of stress recently.  Lost newborn grandchild within the last week.  - Other than rash, she feels fine.  - Currently not taking any medications for chronic medical issues.   - No medications tried for rash/itch.  Tried anti-itch lotion.   - Very similar rash in 11/2014 related to  - Laid off from job last year.  - No new detergents. Uses regular tide. Allergic to Free & clear tide.  Uses Dove soap.  Uses Aveeno lotion when getting out of shower. Luke warm showers. Does use dryer sheets. - No new foods.    Vaginal Discharge/Itching: Currently has discharge, yellow.  Not thick, but is thin.  Began 2 weeks ago.  +Fishy odor off and on for the last year.  Sexually active with husband with significant pain with "raw skin". Uses condoms, unknown type. Estimates last pap test in 2013, normal, with Dr. Allyne Gee.   Chronic Cough/GERD: Takes PPI PRN. No history of asthma. No recent weight loss, night sweats.  W/u for sarcoid in the past. Believes she was seen by ENT, but can't remember recommendations/diagnosis.   Recently had labs done for expected physical that she had to cancel.  - Discussed prediabetes. A1c 6.3.  - Anemia, normocytic: planning on getting iron, TIBC, Vit B12  Patient Active Problem List   Diagnosis Date Noted  . Blood glucose elevated 06/30/2013  . BP (high blood  pressure) 06/30/2013  . Prednisone adverse reaction 06/30/2013  . 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 GERD (gastroesophageal reflux disease) 02/01/2013  . RUQ pain 02/01/2013    Past Medical History  Diagnosis Date  . Allergy   . Hypertension   . Diabetes mellitus without complication (HCC)     due to 6 months of steroids, was never high prior to steroid use for retinitis    History reviewed. No pertinent past surgical history.  Social History  Substance Use Topics  . Smoking status: Never Smoker   . Smokeless tobacco: None  . Alcohol Use: No    Family History  Problem Relation Age of Onset  . Heart disease Mother   . Heart disease Father   . Heart disease Maternal Grandmother     Allergies  Allergen Reactions  . Ivp Dye [Iodinated Diagnostic Agents] Nausea And Vomiting  . Penicillins     Medication list has been reviewed and updated.  Current Outpatient Prescriptions on File Prior to Visit  Medication Sig Dispense Refill  . acetaminophen (TYLENOL) 500 MG tablet Take 1 tablet (500 mg total) by mouth every 6 (six) hours as needed. (Patient not taking: Reported on 02/01/2016) 14 tablet 0  . albuterol (PROVENTIL HFA;VENTOLIN HFA) 108 (90 BASE) MCG/ACT inhaler Inhale 2 puffs into the lungs every 4 (four) hours as needed for wheezing (cough, shortness of  breath or wheezing.). (Patient not taking: Reported on 02/01/2016) 1 Inhaler 12  . cholecalciferol (VITAMIN D) 1000 UNITS tablet Take 500 Units by mouth daily. Reported on 02/01/2016    . cyclobenzaprine (FLEXERIL) 5 MG tablet Take 2 tablets (10 mg total) by mouth 2 (two) times daily as needed for muscle spasms. (Patient not taking: Reported on 02/01/2016) 10 tablet 0  . diphenhydrAMINE (BENADRYL) 25 MG tablet Take 25 mg by mouth every 6 (six) hours as needed. Reported on 02/01/2016    . Hydrocortisone (CORTIZONE-10 ECZEMA EX) Apply topically. Reported on 02/01/2016    . loratadine-pseudoephedrine (CLARITIN-D 24-HOUR) 10-240 MG  per 24 hr tablet Take 1 tablet by mouth daily. Reported on 02/01/2016    . Multiple Vitamin (MULTIVITAMIN) tablet Take 1 tablet by mouth daily. Reported on 02/01/2016    . ranitidine (ZANTAC) 150 MG tablet Take 1 tablet (150 mg total) by mouth 2 (two) times daily. (Patient not taking: Reported on 02/01/2016) 60 tablet 0  . telmisartan (MICARDIS) 20 MG tablet Take 20 mg by mouth daily. Reported on 02/01/2016     No current facility-administered medications on file prior to visit.    Review of Systems:  Review of Systems  Constitutional: Negative for fever, chills, weight loss and malaise/fatigue.  HENT: Negative for congestion, ear pain and sore throat.   Respiratory: Positive for cough. Negative for hemoptysis, shortness of breath and wheezing.   Cardiovascular: Negative for chest pain.  Gastrointestinal: Negative for nausea, vomiting, diarrhea and constipation.  Genitourinary: Positive for dysuria (only with recent vaginal symptoms. ). Negative for frequency.  Musculoskeletal: Negative for myalgias.  Skin: Positive for itching and rash.  Neurological: Negative for dizziness.  Psychiatric/Behavioral: Negative for depression.    Physical Examination: Filed Vitals:   02/01/16 0926  BP: 124/76  Pulse: 91  Temp: 97.9 F (36.6 C)  Resp: 18   Filed Vitals:   02/01/16 0926  Height: 5' 1.5" (1.562 m)  Weight: 193 lb (87.544 kg)   Body mass index is 35.88 kg/(m^2). Ideal Body Weight: Weight in (lb) to have BMI = 25: 134.2  GEN: WDWN, NAD, Non-toxic, A & O x 3 HEENT: Atraumatic, Normocephalic. Neck supple. No masses, No LAD. Ears and Nose: No external deformity. CV: RRR, No M/G/R. No JVD. No thrill. No extra heart sounds. PULM: CTA B, no wheezes, crackles, rhonchi. No retractions. No resp. distress. No accessory muscle use. ABD: S, NT, ND, +BS. No rebound. No HSM. EXTR: No c/c/e NEURO Normal gait.  GU: Dry vaginal introitus and mucosal membranes. Lichenified skin surrounding of all  inguinal skin with white discoloration suggestive of chronic vaginal fungal infection. Thin clear d/c.  Cervical os unremarkable. No mass. Cervix NT on exam. No blood. PSYCH: Normally interactive. Conversant. Not depressed or anxious appearing.  Calm demeanor.  Skin: generalized maculopapular rash of the b/l UE, chest, face, arms, and proximal LE.  Lichenifed skin in axilla, antecubital fossa and soles/palms.    Assessment and Plan: Generalized Pruritis/Eczema: Appears to have chronic poorly controlled eczema.  Does use dryer sheets.  No new exposures.  Hx of similar generalized pruritis in past, 11/2014 with exposure to new laundry detergent.  - Prednisone dose pack - Hydroxyzine prn - Triamcinolone & hydrocortisone ointment prn.  Discussed thinning and skin lightening possibility and to avoid use in skin folds/face.  - f/u 2 weeks  Vaginal itching: Relatively unremarkable wet mount and vaginal exam for intravaginal process. Reports last PAP normal in 2013.  Acute on chronic exacerbation of  symptoms.  Also likely needs vaginal estrogen for dryness once acute irritation resolves.  May have coexistant tinea cruris. - Treat for BV x 7 days.   Chronic Cough: Discussed importance of getting diagnosis to r/o serious medical issue. Since 2012.  Reportedly r/o for sarcoid.  GERD. Takes PPI most days.  H. Pylori IgG in past, but this does not help with diagnosis. Sounds as though she had scope at some point and seen by ENT, but she does not remember results.  Recommended close f/u and further decision to refer to ENT or further eval for COPD/reflux.   Prediabetes: 6.3 on recent labs. Discussed diagnosis briefly.    Normocytic anemia: 11.5 on recent labs. Getting labs as indicated on lab annotation for further discussion at physical.   She will make an appointment for physical in 2 weeks.    Signed Guinevere Scarlet, MD  Over 60 minutes were spent in face to face time with the patient, more than half  of which discussing various treatment options and diagnoses.

## 2016-02-01 NOTE — Patient Instructions (Signed)
Rash:  - Prednisone on a schedule. - Hydroxizine as needed for itching.  - Triamcinolone cream for worst areas.  Do not use on face or skin folds.  - Hydrocortisone for areas of mild rash.  Limit use on face.   Anemia: we added several lab tests for your anemia.    Chronic Cough: This needs to be worked up further.    Make a physical to discuss your chronic medical issues.   Cough, Adult Coughing is a reflex that clears your throat and your airways. Coughing helps to heal and protect your lungs. It is normal to cough occasionally, but a cough that happens with other symptoms or lasts a long time may be a sign of a condition that needs treatment. A cough may last only 2-3 weeks (acute), or it may last longer than 8 weeks (chronic). CAUSES Coughing is commonly caused by:  Breathing in substances that irritate your lungs.  A viral or bacterial respiratory infection.  Allergies.  Asthma.  Postnasal drip.  Smoking.  Acid backing up from the stomach into the esophagus (gastroesophageal reflux).  Certain medicines.  Chronic lung problems, including COPD (or rarely, lung cancer).  Other medical conditions such as heart failure. HOME CARE INSTRUCTIONS  Pay attention to any changes in your symptoms. Take these actions to help with your discomfort:  Take medicines only as told by your health care provider.  If you were prescribed an antibiotic medicine, take it as told by your health care provider. Do not stop taking the antibiotic even if you start to feel better.  Talk with your health care provider before you take a cough suppressant medicine.  Drink enough fluid to keep your urine clear or pale yellow.  If the air is dry, use a cold steam vaporizer or humidifier in your bedroom or your home to help loosen secretions.  Avoid anything that causes you to cough at work or at home.  If your cough is worse at night, try sleeping in a semi-upright position.  Avoid cigarette  smoke. If you smoke, quit smoking. If you need help quitting, ask your health care provider.  Avoid caffeine.  Avoid alcohol.  Rest as needed. SEEK MEDICAL CARE IF:   You have new symptoms.  You cough up pus.  Your cough does not get better after 2-3 weeks, or your cough gets worse.  You cannot control your cough with suppressant medicines and you are losing sleep.  You develop pain that is getting worse or pain that is not controlled with pain medicines.  You have a fever.  You have unexplained weight loss.  You have night sweats. SEEK IMMEDIATE MEDICAL CARE IF:  You cough up blood.  You have difficulty breathing.  Your heartbeat is very fast.   This information is not intended to replace advice given to you by your health care provider. Make sure you discuss any questions you have with your health care provider.   Document Released: 05/27/2011 Document Revised: 08/19/2015 Document Reviewed: 02/04/2015 Elsevier Interactive Patient Education 2016 Elsevier Inc. Rash A rash is a change in the color or texture of the skin. There are many different types of rashes. You may have other problems that accompany your rash. CAUSES   Infections.  Allergic reactions. This can include allergies to pets or foods.  Certain medicines.  Exposure to certain chemicals, soaps, or cosmetics.  Heat.  Exposure to poisonous plants.  Tumors, both cancerous and noncancerous. SYMPTOMS   Redness.  Scaly skin.  Itchy skin.  Dry or cracked skin.  Bumps.  Blisters.  Pain. DIAGNOSIS  Your caregiver may do a physical exam to determine what type of rash you have. A skin sample (biopsy) may be taken and examined under a microscope. TREATMENT  Treatment depends on the type of rash you have. Your caregiver may prescribe certain medicines. For serious conditions, you may need to see a skin doctor (dermatologist). HOME CARE INSTRUCTIONS   Avoid the substance that caused your  rash.  Do not scratch your rash. This can cause infection.  You may take cool baths to help stop itching.  Only take over-the-counter or prescription medicines as directed by your caregiver.  Keep all follow-up appointments as directed by your caregiver. SEEK IMMEDIATE MEDICAL CARE IF:  You have increasing pain, swelling, or redness.  You have a fever.  You have new or severe symptoms.  You have body aches, diarrhea, or vomiting.  Your rash is not better after 3 days. MAKE SURE YOU:  Understand these instructions.  Will watch your condition.  Will get help right away if you are not doing well or get worse.   This information is not intended to replace advice given to you by your health care provider. Make sure you discuss any questions you have with your health care provider.   Document Released: 11/18/2002 Document Revised: 12/19/2014 Document Reviewed: 04/15/2015 Elsevier Interactive Patient Education 2016 Elsevier Inc. Hydrocortisone skin cream, ointment, lotion, or solution What is this medicine? HYDROCORTISONE (hye droe KOR ti sone) is a corticosteroid. It is used on the skin to reduce swelling, redness, itching, and allergic reactions. This medicine may be used for other purposes; ask your health care provider or pharmacist if you have questions. What should I tell my health care provider before I take this medicine? They need to know if you have any of these conditions: -any active infection -diabetes -large areas of burned or damaged skin -skin wasting or thinning -an unusual or allergic reaction to hydrocortisone, corticosteroids, sulfites, other medicines, foods, dyes, or preservatives -pregnant or trying to get pregnant -breast-feeding How should I use this medicine? This medicine is for external use only. Do not take by mouth. Follow the directions on the prescription label. Wash your hands before and after use. Apply a thin film of medicine to the affected  area. Do not cover with a bandage or dressing unless your doctor or health care professional tells you to. Do not use on healthy skin or over large areas of skin. Do not get this medicine in your eyes. If you do, rinse out with plenty of cool tap water. Do not to use more medicine than prescribed. Do not use your medicine more often than directed or for more than 14 days. Talk to your pediatrician regarding the use of this medicine in children. Special care may be needed. While this drug may be prescribed for children as young as 43 years of age for selected conditions, precautions do apply. Do not use this medicine for the treatment of diaper rash unless directed to do so by your doctor or health care professional. If applying this medicine to the diaper area of a child, do not cover with tight-fitting diapers or plastic pants. This may increase the amount of medicine that passes through the skin and increase the risk of serious side effects. Elderly patients are more likely to have damaged skin through aging, and this may increase side effects. This medicine should only be used for brief periods  and infrequently in older patients. Overdosage: If you think you have taken too much of this medicine contact a poison control center or emergency room at once. NOTE: This medicine is only for you. Do not share this medicine with others. What if I miss a dose? If you miss a dose, use it as soon as you can. If it is almost time for your next dose, use only that dose. Do not use double or extra doses. What may interact with this medicine? Interactions are not expected. Do not use any other skin products on the affected area without asking your doctor or health care professional. This list may not describe all possible interactions. Give your health care provider a list of all the medicines, herbs, non-prescription drugs, or dietary supplements you use. Also tell them if you smoke, drink alcohol, or use illegal drugs.  Some items may interact with your medicine. What should I watch for while using this medicine? Tell your doctor or health care professional if your symptoms do not start to get better within 7 days or if they get worse. Tell your doctor or health care professional if you are exposed to anyone with measles or chickenpox, or if you develop sores or blisters that do not heal properly. What side effects may I notice from receiving this medicine? Side effects that you should report to your doctor or health care professional as soon as possible: -allergic reactions like skin rash, itching or hives, swelling of the face, lips, or tongue -burning feeling on the skin -dark red spots on the skin -infection -lack of healing of skin condition -painful, red, pus filled blisters in hair follicles -thinning of the skin Side effects that usually do not require medical attention (report to your doctor or health care professional if they continue or are bothersome): -dry skin, irritation -unusual increased growth of hair on the face or body This list may not describe all possible side effects. Call your doctor for medical advice about side effects. You may report side effects to FDA at 1-800-FDA-1088. Where should I keep my medicine? Keep out of the reach of children. Store at room temperature between 15 and 30 degrees C (59 and 86 degrees F). Do not freeze. Throw away any unused medicine after the expiration date. NOTE: This sheet is a summary. It may not cover all possible information. If you have questions about this medicine, talk to your doctor, pharmacist, or health care provider.    2016, Elsevier/Gold Standard. (2008-04-11 16:08:48)

## 2016-02-02 LAB — IRON AND TIBC
%SAT: 13 % (ref 11–50)
Iron: 52 ug/dL (ref 45–160)
TIBC: 416 ug/dL (ref 250–450)
UIBC: 364 ug/dL (ref 125–400)

## 2016-02-02 LAB — VITAMIN B12: Vitamin B-12: 1182 pg/mL — ABNORMAL HIGH (ref 200–1100)

## 2016-02-02 LAB — FERRITIN: Ferritin: 28 ng/mL (ref 20–288)

## 2016-02-06 ENCOUNTER — Encounter: Payer: Self-pay | Admitting: *Deleted

## 2016-03-05 NOTE — Progress Notes (Signed)
Lab visit only; appointment rescheduled due to provider sickness.

## 2016-06-25 ENCOUNTER — Ambulatory Visit (INDEPENDENT_AMBULATORY_CARE_PROVIDER_SITE_OTHER): Payer: 59 | Admitting: Urgent Care

## 2016-06-25 ENCOUNTER — Ambulatory Visit (INDEPENDENT_AMBULATORY_CARE_PROVIDER_SITE_OTHER): Payer: Commercial Managed Care - HMO

## 2016-06-25 VITALS — BP 140/80 | HR 78 | Temp 97.9°F | Resp 12 | Ht 60.0 in | Wt 189.0 lb

## 2016-06-25 DIAGNOSIS — R1110 Vomiting, unspecified: Secondary | ICD-10-CM | POA: Diagnosis not present

## 2016-06-25 DIAGNOSIS — R05 Cough: Secondary | ICD-10-CM

## 2016-06-25 DIAGNOSIS — J029 Acute pharyngitis, unspecified: Secondary | ICD-10-CM

## 2016-06-25 DIAGNOSIS — R059 Cough, unspecified: Secondary | ICD-10-CM

## 2016-06-25 LAB — CBC
HEMATOCRIT: 35.7 % (ref 35.0–45.0)
HEMOGLOBIN: 11.7 g/dL (ref 11.7–15.5)
MCH: 28 pg (ref 27.0–33.0)
MCHC: 32.8 g/dL (ref 32.0–36.0)
MCV: 85.4 fL (ref 80.0–100.0)
MPV: 10.8 fL (ref 7.5–12.5)
Platelets: 296 10*3/uL (ref 140–400)
RBC: 4.18 MIL/uL (ref 3.80–5.10)
RDW: 15 % (ref 11.0–15.0)
WBC: 4.8 10*3/uL (ref 3.8–10.8)

## 2016-06-25 MED ORDER — AZITHROMYCIN 250 MG PO TABS: ORAL_TABLET | ORAL | Status: DC

## 2016-06-25 MED ORDER — CETIRIZINE HCL 10 MG PO TABS: 10.0000 mg | ORAL_TABLET | Freq: Every day | ORAL | Status: DC

## 2016-06-25 MED ORDER — OMEPRAZOLE 20 MG PO CPDR: 20.0000 mg | DELAYED_RELEASE_CAPSULE | Freq: Every day | ORAL | Status: DC

## 2016-06-25 MED ORDER — BENZONATATE 100 MG PO CAPS: 100.0000 mg | ORAL_CAPSULE | Freq: Three times a day (TID) | ORAL | Status: DC | PRN

## 2016-06-25 MED ORDER — HYDROCODONE-HOMATROPINE 5-1.5 MG/5ML PO SYRP: 5.0000 mL | ORAL_SOLUTION | Freq: Every evening | ORAL | Status: DC | PRN

## 2016-06-25 NOTE — Patient Instructions (Addendum)
Cough, Adult Coughing is a reflex that clears your throat and your airways. Coughing helps to heal and protect your lungs. It is normal to cough occasionally, but a cough that happens with other symptoms or lasts a long time may be a sign of a condition that needs treatment. A cough may last only 2-3 weeks (acute), or it may last longer than 8 weeks (chronic). CAUSES Coughing is commonly caused by:  Breathing in substances that irritate your lungs.  A viral or bacterial respiratory infection.  Allergies.  Asthma.  Postnasal drip.  Smoking.  Acid backing up from the stomach into the esophagus (gastroesophageal reflux).  Certain medicines.  Chronic lung problems, including COPD (or rarely, lung cancer).  Other medical conditions such as heart failure. HOME CARE INSTRUCTIONS  Pay attention to any changes in your symptoms. Take these actions to help with your discomfort:  Take medicines only as told by your health care provider.  If you were prescribed an antibiotic medicine, take it as told by your health care provider. Do not stop taking the antibiotic even if you start to feel better.  Talk with your health care provider before you take a cough suppressant medicine.  Drink enough fluid to keep your urine clear or pale yellow.  If the air is dry, use a cold steam vaporizer or humidifier in your bedroom or your home to help loosen secretions.  Avoid anything that causes you to cough at work or at home.  If your cough is worse at night, try sleeping in a semi-upright position.  Avoid cigarette smoke. If you smoke, quit smoking. If you need help quitting, ask your health care provider.  Avoid caffeine.  Avoid alcohol.  Rest as needed. SEEK MEDICAL CARE IF:   You have new symptoms.  You cough up pus.  Your cough does not get better after 2-3 weeks, or your cough gets worse.  You cannot control your cough with suppressant medicines and you are losing sleep.  You  develop pain that is getting worse or pain that is not controlled with pain medicines.  You have a fever.  You have unexplained weight loss.  You have night sweats. SEEK IMMEDIATE MEDICAL CARE IF:  You cough up blood.  You have difficulty breathing.  Your heartbeat is very fast.   This information is not intended to replace advice given to you by your health care provider. Make sure you discuss any questions you have with your health care provider.   Document Released: 05/27/2011 Document Revised: 08/19/2015 Document Reviewed: 02/04/2015 Elsevier Interactive Patient Education 2016 [employer].    Food Choices for Gastroesophageal Reflux Disease, Adult When you have gastroesophageal reflux disease (GERD), the foods you eat and your eating habits are very important. Choosing the right foods can help ease the discomfort of GERD. WHAT GENERAL GUIDELINES DO I NEED TO FOLLOW?  Choose fruits, vegetables, whole grains, low-fat dairy products, and low-fat meat, fish, and poultry.  Limit fats such as oils, salad dressings, butter, nuts, and avocado.  Keep a food diary to identify foods that cause symptoms.  Avoid foods that cause reflux. These may be different for different people.  Eat frequent small meals instead of three large meals each day.  Eat your meals slowly, in a relaxed setting.  Limit fried foods.  Cook foods using methods other than frying.  Avoid drinking alcohol.  Avoid drinking large amounts of liquids with your meals.  Avoid bending over or lying down until 2-3 hours after  eating. WHAT FOODS ARE NOT RECOMMENDED? The following are some foods and drinks that may worsen your symptoms: Vegetables Tomatoes. Tomato juice. Tomato and spaghetti sauce. Chili peppers. Onion and garlic. Horseradish. Fruits Oranges, grapefruit, and lemon (fruit and juice). Meats High-fat meats, fish, and poultry. This includes hot dogs, ribs, ham, sausage, salami, and  bacon. Dairy Whole milk and chocolate milk. Sour cream. Cream. Butter. Ice cream. Cream cheese.  Beverages Coffee and tea, with or without caffeine. Carbonated beverages or energy drinks. Condiments Hot sauce. Barbecue sauce.  Sweets/Desserts Chocolate and cocoa. Donuts. Peppermint and spearmint. Fats and Oils High-fat foods, including [place] fries and potato chips. Other Vinegar. Strong spices, such as black pepper, white pepper, red pepper, cayenne, curry powder, cloves, ginger, and chili powder. The items listed above may not be a complete list of foods and beverages to avoid. Contact your dietitian for more information.   This information is not intended to replace advice given to you by your health care provider. Make sure you discuss any questions you have with your health care provider.   Document Released: 11/28/2005 Document Revised: 12/19/2014 Document Reviewed: 10/02/2013 Elsevier Interactive Patient Education [employer]2016 Elsevier Inc.

## 2016-06-25 NOTE — Progress Notes (Signed)
    MRN: [id number] DOB: 10/20/1956  Subjective:   Patricia Murphy is a 60 y.o. female presenting for chief complaint of Cough and Emesis  Reports longstanding history of cough for the past 5 years. This episode started 1 week ago, has a productive cough, has vomiting episodes with her cough. Patient has not been able to hold foods down, also has a hard time with water. Admits burning sensation in her throat and heart burn. Has alternated between H-2 blockers, has been using Zantac lately with minimal relief. Denies fever, chest pain, shob, weight loss. Denies smoking cigarettes. Per patient, she has had extensive work up, was negative for asthma, sarcoidosis. Failed breathing treatments. She has not been seen by a pulmonologist, gastroenterology.  Patricia Murphy has a current medication list which includes the following prescription(s): multivitamin. Also is allergic to ivp dye and penicillins.  Patricia Murphy  has a past medical history of Allergy; Hypertension; and Diabetes mellitus without complication (HCC). Also  has no past surgical history on file.  Objective:   Vitals: BP 140/80 mmHg  Pulse 78  Temp(Src) 97.9 F (36.6 C) (Oral)  Resp 12  Ht 5' (1.524 m)  Wt 189 lb (85.73 kg)  BMI 36.91 kg/m2  SpO2 97%  Wt Readings from Last 3 Encounters:  06/25/16 189 lb (85.73 kg)  02/01/16 193 lb (87.544 kg)  03/01/15 178 lb 6.4 oz (80.922 kg)    Physical Exam  Constitutional: She is oriented to person, place, and time. She appears well-developed and well-nourished.  HENT:  Mouth/Throat: Oropharynx is clear and moist.  Eyes: Right eye exhibits no discharge. Left eye exhibits no discharge. No scleral icterus.  Cardiovascular: Normal rate, regular rhythm and intact distal pulses.  Exam reveals no gallop and no friction rub.   No murmur heard. Pulmonary/Chest: No respiratory distress. She has no wheezes. She has no rales.  Abdominal: Soft. Bowel sounds are normal. She exhibits no distension and no mass.  There is no tenderness.  Neurological: She is alert and oriented to person, place, and time.  Skin: Skin is warm and dry.   Dg Chest 2 View  06/25/2016  CLINICAL DATA:  Cough. EXAM: CHEST  2 VIEW COMPARISON:  03/04/2013 FINDINGS: Heart size is normal. No pleural effusion or edema. Aortic atherosclerosis noted. There is a focal opacity identified within the lingular portion of the left lung, new from previous exam and suspicious for pneumonia. IMPRESSION: 1. Suspect the lingular pneumonia. Electronically Signed   By: Signa Kellaylor  Stroud M.D.   On: 06/25/2016 10:05   Assessment and Plan :   1. Cough 2. Post-tussive emesis 3. Sore throat - X-ray suspicious for lingular pneumonia. I will address this with azithromycin, Hycodan and Tessalon for cough and sore throat. She still needs to see pulmonology for her chronic cough but she is to start Zyrtec to address allergies, Omeprazole to address GERD. - RTC for annual physical, patient requested that I be her PCP.  Wallis BambergMario Eternity Dexter, PA-C [facility] [phone] 06/25/2016 9:17 AM

## 2016-06-29 LAB — BORDETELLA PERTUSSIS AB IGG,IGA
FHA IgA: 44 IU/mL
FHA IgG: 89 IU/mL
PT IGG: 8 [IU]/mL
PT IgA: 2 IU/mL

## 2016-07-01 ENCOUNTER — Encounter: Payer: Self-pay | Admitting: Urgent Care

## 2016-07-07 ENCOUNTER — Institutional Professional Consult (permissible substitution): Payer: Self-pay | Admitting: Internal Medicine

## 2016-07-07 ENCOUNTER — Encounter: Payer: Self-pay | Admitting: Internal Medicine

## 2016-07-07 ENCOUNTER — Ambulatory Visit (INDEPENDENT_AMBULATORY_CARE_PROVIDER_SITE_OTHER): Payer: Commercial Managed Care - HMO | Admitting: Internal Medicine

## 2016-07-07 ENCOUNTER — Ambulatory Visit (INDEPENDENT_AMBULATORY_CARE_PROVIDER_SITE_OTHER)
Admission: RE | Admit: 2016-07-07 | Discharge: 2016-07-07 | Disposition: A | Discharge status: Home / Self Care | Payer: Commercial Managed Care - HMO | Source: Ambulatory Visit | Attending: Internal Medicine | Admitting: Internal Medicine

## 2016-07-07 ENCOUNTER — Other Ambulatory Visit (INDEPENDENT_AMBULATORY_CARE_PROVIDER_SITE_OTHER): Payer: Commercial Managed Care - HMO

## 2016-07-07 DIAGNOSIS — J45991 Cough variant asthma: Secondary | ICD-10-CM

## 2016-07-07 LAB — CBC WITH DIFFERENTIAL/PLATELET
BASOS ABS: 0 10*3/uL (ref 0.0–0.1)
Basophils Relative: 0.6 % (ref 0.0–3.0)
Eosinophils Absolute: 0.2 10*3/uL (ref 0.0–0.7)
Eosinophils Relative: 3.2 % (ref 0.0–5.0)
HCT: 35.5 % — ABNORMAL LOW (ref 36.0–46.0)
Hemoglobin: 11.8 g/dL — ABNORMAL LOW (ref 12.0–15.0)
LYMPHS ABS: 2.8 10*3/uL (ref 0.7–4.0)
Lymphocytes Relative: 42.9 % (ref 12.0–46.0)
MCHC: 33.3 g/dL (ref 30.0–36.0)
MCV: 83.6 fl (ref 78.0–100.0)
MONO ABS: 0.4 10*3/uL (ref 0.1–1.0)
MONOS PCT: 6.3 % (ref 3.0–12.0)
NEUTROS PCT: 47 % (ref 43.0–77.0)
Neutro Abs: 3 10*3/uL (ref 1.4–7.7)
Platelets: 282 10*3/uL (ref 150.0–400.0)
RBC: 4.24 Mil/uL (ref 3.87–5.11)
RDW: 15.1 % (ref 11.5–15.5)
WBC: 6.5 10*3/uL (ref 4.0–10.5)

## 2016-07-07 LAB — NITRIC OXIDE: NITRIC OXIDE: 10

## 2016-07-07 MED ORDER — FAMOTIDINE 20 MG PO TABS: ORAL_TABLET | ORAL | 11 refills | Status: DC

## 2016-07-07 MED ORDER — PANTOPRAZOLE SODIUM 40 MG PO TBEC: 40.0000 mg | DELAYED_RELEASE_TABLET | Freq: Every day | ORAL | 2 refills | Status: DC

## 2016-07-07 NOTE — Patient Instructions (Addendum)
Pantoprazole (protonix) 40 mg   Take  30-60 min before first meal of the day and Pepcid (famotidine)  20 mg one @  bedtime until return to office - this is the best way to tell whether stomach acid is contributing to your problem.    For drainage / throat tickle try take CHLORPHENIRAMINE  4 mg - take one every 4 hours as needed - available over the counter- may cause drowsiness so start with just a bedtime dose or two and see how you tolerate it before trying in daytime  Up to every 4 hours as needed   Finish up your cough medicine  GERD (REFLUX)  is an extremely common cause of respiratory symptoms just like yours , many times with no obvious heartburn at all.    It can be treated with medication, but also with lifestyle changes including elevation of the head of your bed (ideally with 6 inch  bed blocks),  Smoking cessation, avoidance of late meals, excessive alcohol, and avoid fatty foods, chocolate, peppermint, colas, red wine, and acidic juices such as orange juice.  NO MINT OR MENTHOL PRODUCTS SO NO COUGH DROPS  USE SUGARLESS CANDY INSTEAD (Jolley ranchers or Stover's or Life Savers) or even ice chips will also do - the key is to swallow to prevent all throat clearing. NO OIL BASED VITAMINS - use powdered substitutes.   Please remember to go to the lab and x-ray department downstairs for your tests - we will call you with the results when they are available.  Please schedule a follow up office visit in 4 weeks, sooner if needed

## 2016-07-07 NOTE — Progress Notes (Signed)
Subjective:     Patient ID: Patricia Murphy, female   DOB: March 18, 1956,     MRN: [id number]  HPI  69 yobf never smoker with lifelong  tendency to rhinorrhea/ sneezing/coughing typically  worse in August controlled with otc's but pattern changed around 2012 where had persistent cough despite good control of nasal symptoms so eval by ENT ? When ? What rx (does not recall)  > not better so referred to pulmonary clinic 07/07/2016 by Wallis Bamberg PA with w/u neg B pertussis/ ? Lingular pna / rx zpak/omeprazole 20 06/25/16    07/07/2016 1st [facility] office visit/ Lorayne Getchell   Chief Complaint  Patient presents with  . Pulmonary Consult    Referred by Dr. Urban Gibson. Pt c/o cough x 5 yrs. Cough is mainly non prod, but will occ produce some clear sputum.  She states she sometimes coughs until she vomits. She states that talking can be a trigger for cough.    cough is present daily x 5 years, worse with perfumes/ speaking not as likely sleeping  Much worse since around 06/20/16  to point where vomiting > UC > zpak  And back to baseline cough by time of ov  Taking omeprazole 20 mg /day  x two years / pred in past  no change   No obvious day to day or daytime variability or assoc sob or excess/ purulent sputum or mucus plugs or hemoptysis or cp or chest tightness, subjective wheeze or overt sinus or hb symptoms. No unusual exp hx or h/o childhood pna/ asthma or knowledge of premature birth.  Sleeping ok without nocturnal  or early am exacerbation  of respiratory  c/o's or need for noct saba. Also denies any obvious fluctuation of symptoms with weather or environmental changes or other aggravating or alleviating factors except as outlined above   Current Medications, Allergies, Complete Past Medical History, Past Surgical History, Family History, and Social History were reviewed in [employer] record.  ROS  The following are not active complaints unless bolded sore throat, dysphagia, dental  problems, itching, sneezing,  nasal congestion or excess/ purulent secretions, ear ache,   fever, chills, sweats, unintended wt loss, classically pleuritic or exertional cp,  orthopnea pnd or leg swelling, presyncope, palpitations, abdominal pain, anorexia, nausea, vomiting, diarrhea  or change in bowel or bladder habits, change in stools or urine, dysuria,hematuria,  rash, arthralgias, visual complaints, headache, numbness, weakness or ataxia or problems with walking or coordination,  change in mood/affect or memory.          Review of Systems     Objective:   Physical Exam amb obese bm nad "something stuck in my throat"  Wt Readings from Last 3 Encounters:  07/07/16 189 lb 12.8 oz (86.1 kg)  06/25/16 189 lb (85.7 kg)  02/01/16 193 lb (87.5 kg)    Vital signs reviewed   HEENT: very poor dentition / nl  turbinates, and oropharynx. Nl external ear canals without cough reflex   NECK :  without JVD/Nodes/TM/ nl carotid upstrokes bilaterally   LUNGS: no acc muscle use,  Nl contour chest which is clear to A and P bilaterally without cough on insp or exp maneuvers   CV:  RRR  no s3 or murmur or increase in P2, no edema   ABD:  soft and nontender with nl inspiratory excursion in the supine position. No bruits or organomegaly, bowel sounds nl  MS:  Nl gait/ ext warm without deformities, calf tenderness, cyanosis or  clubbing No obvious joint restrictions   SKIN: warm and dry without lesions    NEURO:  alert, approp, nl sensorium with  no motor deficits      CXR PA and Lateral:   07/07/2016 :    I personally reviewed images and agree with radiology impression as follows:      No radiographic evidence of acute cardiopulmonary disease.    labs ordered 07/07/2016   CBC with diff / allergy profile  Assessment:

## 2016-07-08 NOTE — Progress Notes (Signed)
Spoke with pt and notified of results per Dr. Wert. Pt verbalized understanding and denied any questions. 

## 2016-07-08 NOTE — Assessment & Plan Note (Addendum)
FENO 07/07/2016  =   10  - Allergy profile 07/07/2016 >  Eos 0.2/  IgE   - 07/07/2016 max gerd rx/ 1st gen H1  In the setting of low FENO, absence of noct flares, most likely dx is  Upper airway cough syndrome, so named because it's frequently impossible to sort out how much is  CR/sinusitis with freq throat clearing (which can be related to primary GERD)   vs  causing  secondary (" extra esophageal")  GERD from wide swings in gastric pressure that occur with throat clearing, often  promoting self use of mint and menthol lozenges that reduce the lower esophageal sphincter tone and exacerbate the problem further in a cyclical fashion.   These are the same pts (now being labeled as having "irritable larynx syndrome" by some cough centers) who not infrequently have a history of having failed to tolerate ace inhibitors,  dry powder inhalers or biphosphonates or report having atypical reflux symptoms that don't respond to standard doses of PPI , and are easily confused as having aecopd or asthma flares by even experienced allergists/ pulmonologists.   For now rec max rx directed at reflux and w/u for underlying allergy/ treat pnds with 1st gen h1 per guidelines then regroup   Total time devoted to counseling  = 35/41m review case with pt/ discussion of options/alternatives/ personally creating written instructions  in presence of pt  then going over those specific  Instructions directly with the pt including how to use all of the meds but in particular covering each new medication in detail and the difference between the maintenance/automatic meds and the prns using an action plan format for the latter.

## 2016-07-11 LAB — RESPIRATORY ALLERGY PROFILE REGION II ~~LOC~~
ALLERGEN, D PTERNOYSSINUS, D1: 44 kU/L — AB
ALLERGEN, OAK, T7: 5 kU/L — AB
Allergen, Cedar tree, t12: 8.99 kU/L — ABNORMAL HIGH
Allergen, Comm Silver Birch, t9: 6.24 kU/L — ABNORMAL HIGH
Allergen, Mouse Urine Protein, e78: <0.1 kU/L
Allergen, Mulberry, t76: 0.64 kU/L — ABNORMAL HIGH
Alternaria Alternata: <0.1 kU/L
Aspergillus fumigatus, m3: <0.1 kU/L
Bermuda Grass: <0.1 kU/L
Box Elder IgE: 0.42 kU/L — ABNORMAL HIGH
Cladosporium Herbarum: <0.1 kU/L
Cockroach: 1.3 kU/L — ABNORMAL HIGH
Common Ragweed: 2.06 kU/L — ABNORMAL HIGH
D. FARINAE: 59 kU/L — AB
ELM IGE: 1.49 kU/L — AB
IgE (Immunoglobulin E), Serum: 270 kU/L — ABNORMAL HIGH (ref <115)
PECAN/HICKORY TREE IGE: 5.75 kU/L — AB
Penicillium Notatum: <0.1 kU/L
Rough Pigweed  IgE: <0.1 kU/L
Sheep Sorrel IgE: <0.1 kU/L
TIMOTHY GRASS: 1.62 kU/L — AB

## 2016-07-12 ENCOUNTER — Telehealth: Payer: Self-pay | Admitting: Internal Medicine

## 2016-07-12 NOTE — Telephone Encounter (Signed)
Nyoka Cowden, MD sent to Christen Butter, CMA        Call patient : Studies are c/w severe allergies to trees and grass and ragweed   Spoke with pt and notified of results per Dr. Sherene Sires. Pt verbalized understanding and denied any questions.

## 2016-07-12 NOTE — Progress Notes (Signed)
Spoke with pt and notified of results per Dr. Wert. Pt verbalized understanding and denied any questions. 

## 2016-07-13 ENCOUNTER — Ambulatory Visit (INDEPENDENT_AMBULATORY_CARE_PROVIDER_SITE_OTHER)
Admission: RE | Admit: 2016-07-13 | Discharge: 2016-07-13 | Disposition: A | Discharge status: Home / Self Care | Payer: Commercial Managed Care - HMO | Source: Ambulatory Visit | Attending: Internal Medicine | Admitting: Internal Medicine

## 2016-07-13 DIAGNOSIS — J45991 Cough variant asthma: Secondary | ICD-10-CM | POA: Diagnosis not present

## 2016-08-02 ENCOUNTER — Institutional Professional Consult (permissible substitution): Payer: Self-pay | Admitting: Internal Medicine

## 2016-08-05 ENCOUNTER — Ambulatory Visit (INDEPENDENT_AMBULATORY_CARE_PROVIDER_SITE_OTHER): Payer: Commercial Managed Care - HMO | Admitting: Internal Medicine

## 2016-08-05 ENCOUNTER — Encounter: Payer: Self-pay | Admitting: Internal Medicine

## 2016-08-05 VITALS — BP 112/80 | HR 83 | Ht 60.0 in | Wt 187.0 lb

## 2016-08-05 DIAGNOSIS — J45991 Cough variant asthma: Secondary | ICD-10-CM

## 2016-08-05 DIAGNOSIS — J302 Other seasonal allergic rhinitis: Secondary | ICD-10-CM | POA: Diagnosis not present

## 2016-08-05 DIAGNOSIS — J309 Allergic rhinitis, unspecified: Secondary | ICD-10-CM | POA: Insufficient documentation

## 2016-08-05 NOTE — Progress Notes (Signed)
Subjective:     Patient ID: Patricia Murphy, female   DOB: [birth year],     MRN: [id number]    Brief patient profile:  [age] yobf never smoker with lifelong  tendency to rhinorrhea/ sneezing/coughing typically  worse in August controlled with otc's but pattern changed around 2012 where had persistent cough despite good control of nasal symptoms so eval by ENT ? When ? What rx (does not recall)  > not better so referred to pulmonary clinic 07/07/2016 by Wallis BambergMario Mani PA with w/u neg B pertussis/ ? Lingular pna / rx zpak/omeprazole 20 06/25/16     History of Present Illness  07/07/2016 1st [facility] office visit/ Caspar Favila   Chief Complaint  Patient presents with  . Pulmonary Consult    Referred by Dr. Urban GibsonMani. Pt c/o cough x 5 yrs. Cough is mainly non prod, but will occ produce some clear sputum.  She states she sometimes coughs until she vomits. She states that talking can be a trigger for cough.    cough is present daily x 5 years, worse with perfumes/ speaking not as likely sleeping  Much worse since around 06/20/16  to point where vomiting > UC > zpak  And back to baseline cough by time of ov  Taking omeprazole 20 mg /day  x two years / pred in past  no change  rec Pantoprazole (protonix) 40 mg   Take  30-60 min before first meal of the day and Pepcid (famotidine)  20 mg one @  bedtime until return to office  .   For drainage / throat tickle try take CHLORPHENIRAMINE  4 mg - take one every 4 hours as needed -  Finish up your cough medicine GERD  Diet    08/05/2016  f/u ov/Robyne Matar re:  Uacs/ all better on gerd rx and 1st gen h1  Chief Complaint  Patient presents with  . Follow-up    Cough is much improved. She can talk a long period of time without coughing.    cough flares when not taking ppi    No obvious day to day or daytime variability or assoc sob or excess/ purulent sputum or mucus plugs or hemoptysis or cp or chest tightness, subjective wheeze or overt sinus or hb symptoms. No unusual  exp hx or h/o childhood pna/ asthma or knowledge of premature birth.  Sleeping ok without nocturnal  or early am exacerbation  of respiratory  c/o's or need for noct saba. Also denies any obvious fluctuation of symptoms with weather or environmental changes or other aggravating or alleviating factors except as outlined above   Current Medications, Allergies, Complete Past Medical History, Past Surgical History, Family History, and Social History were reviewed in [facility] electronic medical record.  ROS  The following are not active complaints unless bolded sore throat, dysphagia, dental problems, itching, sneezing,  nasal congestion or excess/ purulent secretions, ear ache,   fever, chills, sweats, unintended wt loss, classically pleuritic or exertional cp,  orthopnea pnd or leg swelling, presyncope, palpitations, abdominal pain, anorexia, nausea, vomiting, diarrhea  or change in bowel or bladder habits, change in stools or urine, dysuria,hematuria,  rash, arthralgias, visual complaints, headache, numbness, weakness or ataxia or problems with walking or coordination,  change in mood/affect or memory.               Objective:   Physical Exam   amb obese bm nad  / all smiles    08/05/2016  187   07/07/16 189 lb 12.8 oz (86.1 kg)  06/25/16 189 lb (85.7 kg)  02/01/16 193 lb (87.5 kg)    Vital signs reviewed   HEENT: very poor dentition / nl  turbinates, and oropharynx. Nl external ear canals without cough reflex   NECK :  without JVD/Nodes/TM/ nl carotid upstrokes bilaterally   LUNGS: no acc muscle use,  Nl contour chest which is clear to A and P bilaterally without cough on insp or exp maneuvers   CV:  RRR  no s3 or murmur or increase in P2, no edema   ABD:  soft and nontender with nl inspiratory excursion in the supine position. No bruits or organomegaly, bowel sounds nl  MS:  Nl gait/ ext warm without deformities, calf tenderness, cyanosis or clubbing No obvious  joint restrictions   SKIN: warm and dry without lesions    NEURO:  alert, approp, nl sensorium with  no motor deficits     CXR PA and Lateral:   07/07/2016 :    I personally reviewed images and agree with radiology impression as follows:      No radiographic evidence of acute cardiopulmonary disease.    Assessment:

## 2016-08-05 NOTE — Assessment & Plan Note (Addendum)
FENO 07/07/2016  =   10  - Allergy profile 07/07/2016 >  Eos 0.2/  IgE  270 POS RAST trees and grass and ragweed - 07/07/2016 max gerd rx/ 1st gen H1  - Sinus CT 07/13/16 > Normal paranasal sinuses     I had an extended final summary discussion with the patient reviewing all relevant studies completed to date and  lasting 15 to 20 minutes of a 25 minute visit on the following issues:    No evidence of asthma/ only coughs when forgets to take gerd rx   Each maintenance medication was reviewed in detail including most importantly the difference between maintenance and as needed and under what circumstances the prns are to be used.  Please see instructions for details which were reviewed in writing and the patient given a copy.    Pulmonary f/u can be prn once establishes with primary care

## 2016-08-05 NOTE — Assessment & Plan Note (Signed)
Reviewed use of otc's > if not well controlled needs formal allergy eval next   In meantime referred to IM for primary care purposes

## 2016-08-05 NOTE — Patient Instructions (Addendum)
Please see patient coordinator before you leave today  to schedule Internal  Medicine referral (or [facility] can [facility] (Kristi Katrinka BlazingSmith does primary care too and has seen you before)   Stay on same medications for the next 6 weeks   Please schedule a follow up office visit in 6 weeks but ok to cancel if you've established with primary care by then

## 2016-09-16 ENCOUNTER — Ambulatory Visit: Payer: Commercial Managed Care - HMO | Admitting: Internal Medicine

## 2016-10-21 ENCOUNTER — Other Ambulatory Visit: Payer: Self-pay | Admitting: Internal Medicine

## 2016-10-21 DIAGNOSIS — J45991 Cough variant asthma: Secondary | ICD-10-CM

## 2016-10-28 ENCOUNTER — Telehealth: Payer: Self-pay | Admitting: Internal Medicine

## 2016-10-28 DIAGNOSIS — J45991 Cough variant asthma: Secondary | ICD-10-CM

## 2016-10-28 MED ORDER — PANTOPRAZOLE SODIUM 40 MG PO TBEC: 40.0000 mg | DELAYED_RELEASE_TABLET | Freq: Every day | ORAL | 2 refills | Status: DC

## 2016-10-28 NOTE — Telephone Encounter (Signed)
Spoke with pt. She is needing a refill on Pantoprazole. This has been sent in. Nothing further was needed.

## 2016-12-23 IMAGING — DX DG CHEST 2V
2 series · 2 of 2 positions shown · non-contrast
Comparison: Chest x-ray 06/25/2016.

CLINICAL DATA: 59-year-old female with history of cough for the
past 2 weeks. Currently on antibiotics.

EXAM:
CHEST  2 VIEW

[chest pa]
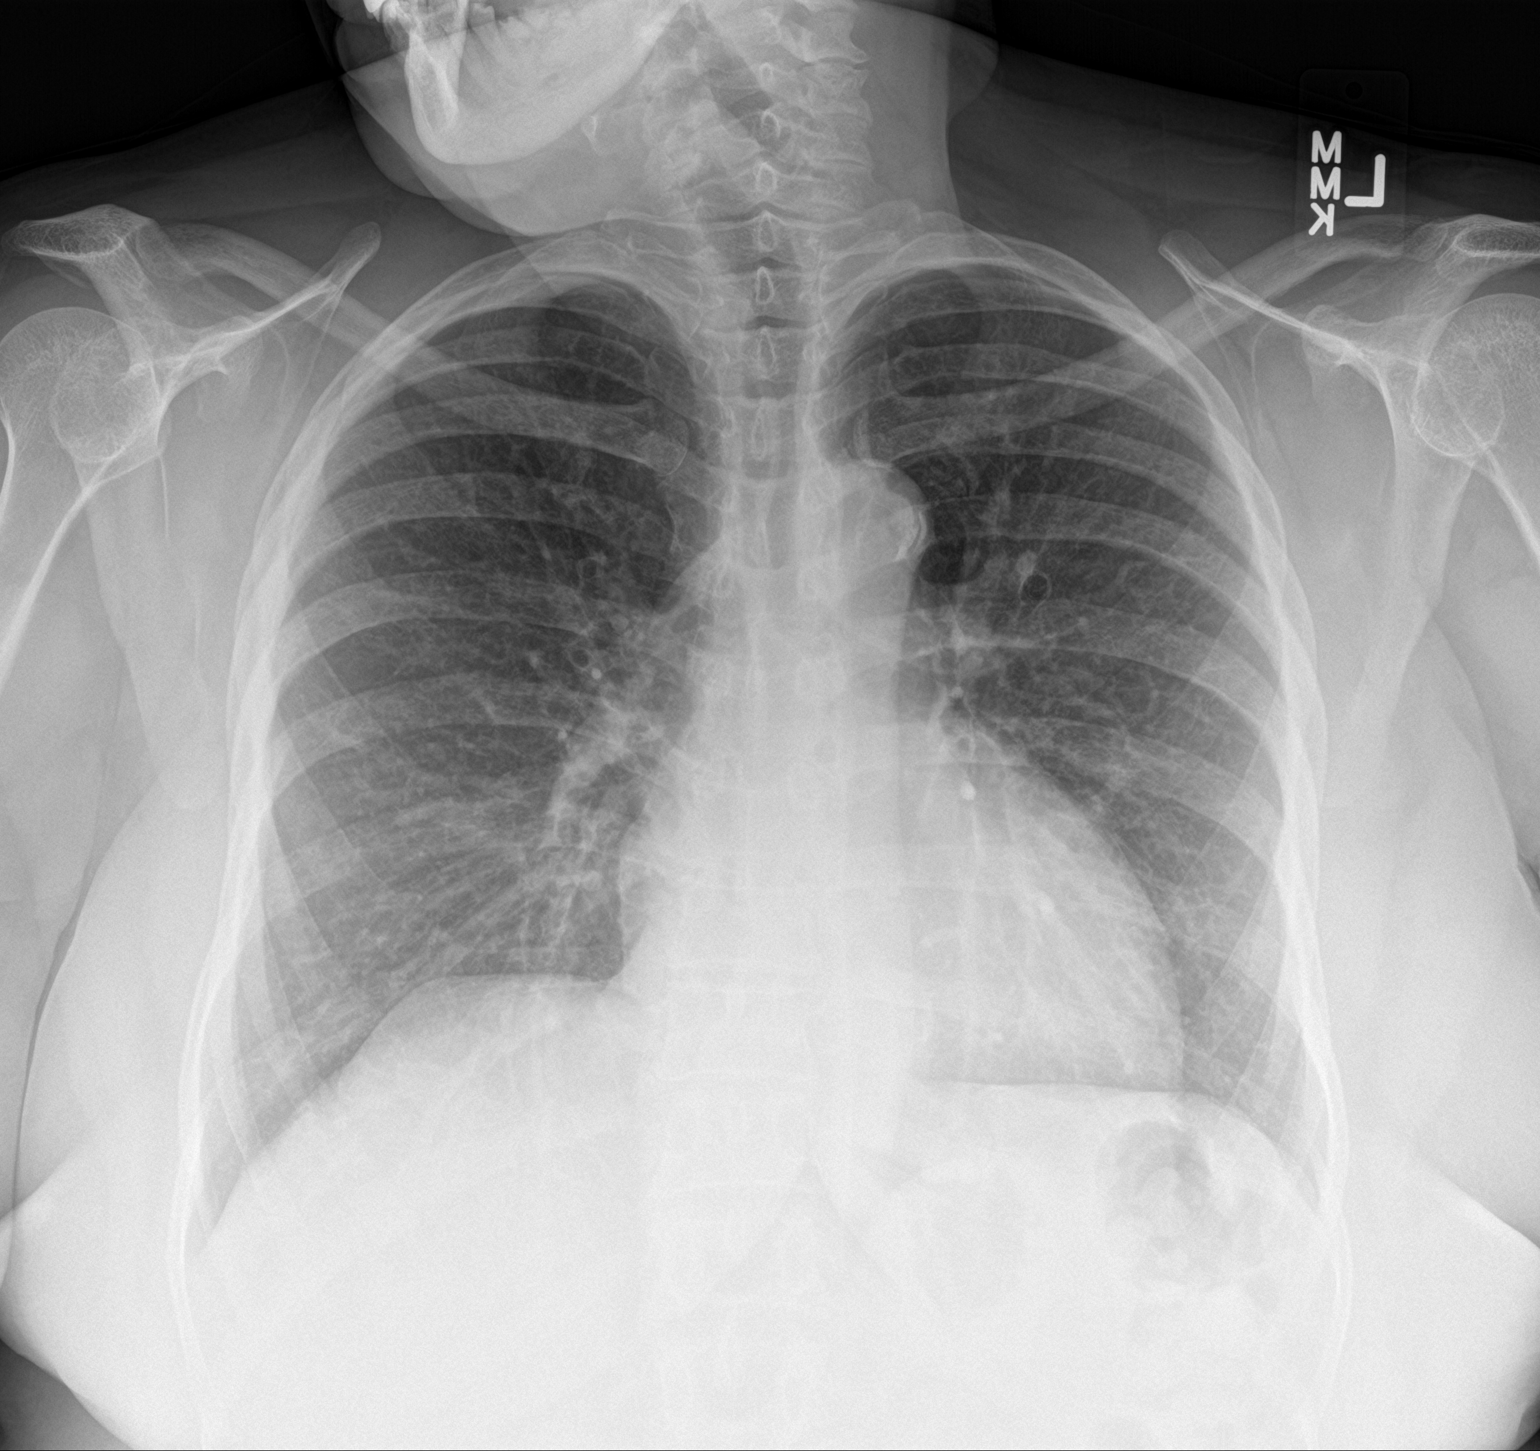

[chest lat]
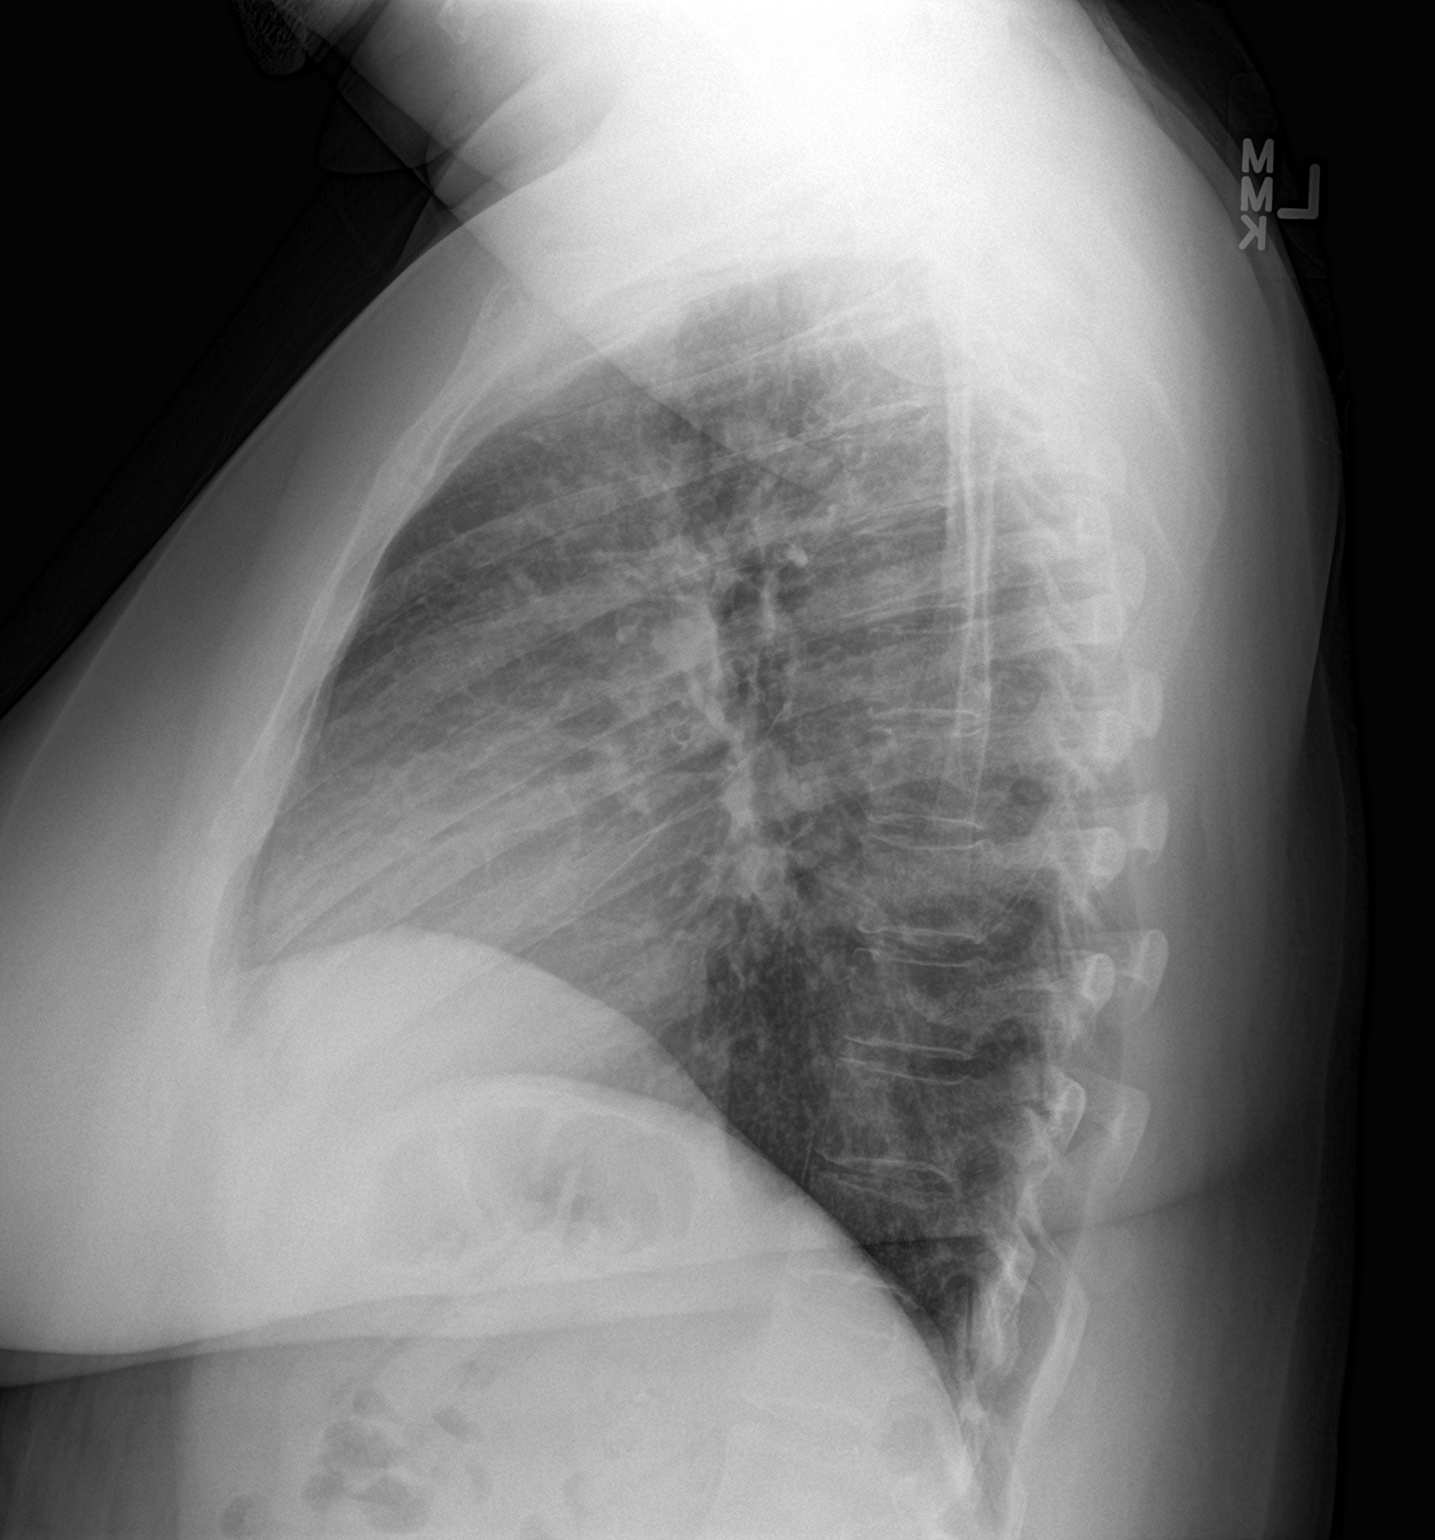

[2 of 2 positions shown; findings below may reference images not displayed]

FINDINGS: Lung volumes are normal. No consolidative airspace disease. No
pleural effusions. No pneumothorax. No pulmonary nodule or mass
noted. Pulmonary vasculature and the cardiomediastinal silhouette
are within normal limits. Atherosclerosis in the thoracic aorta.
IMPRESSION: 1.  No radiographic evidence of acute cardiopulmonary disease.
2. Aortic atherosclerosis.

## 2017-05-17 ENCOUNTER — Encounter: Payer: Self-pay | Admitting: Family Medicine

## 2017-05-17 ENCOUNTER — Encounter: Payer: Self-pay | Admitting: Gastroenterology

## 2017-05-17 ENCOUNTER — Other Ambulatory Visit (HOSPITAL_COMMUNITY)
Admission: RE | Admit: 2017-05-17 | Discharge: 2017-05-17 | Disposition: A | Discharge status: Home / Self Care | Payer: 59 | Source: Ambulatory Visit | Attending: Family Medicine | Admitting: Family Medicine

## 2017-05-17 ENCOUNTER — Ambulatory Visit (INDEPENDENT_AMBULATORY_CARE_PROVIDER_SITE_OTHER): Payer: Commercial Managed Care - HMO | Admitting: Family Medicine

## 2017-05-17 VITALS — BP 132/70 | HR 62 | Ht 60.5 in | Wt 179.4 lb

## 2017-05-17 DIAGNOSIS — R05 Cough: Secondary | ICD-10-CM

## 2017-05-17 DIAGNOSIS — Z0000 Encounter for general adult medical examination without abnormal findings: Secondary | ICD-10-CM

## 2017-05-17 DIAGNOSIS — I10 Essential (primary) hypertension: Secondary | ICD-10-CM | POA: Diagnosis not present

## 2017-05-17 DIAGNOSIS — Z8249 Family history of ischemic heart disease and other diseases of the circulatory system: Secondary | ICD-10-CM

## 2017-05-17 DIAGNOSIS — Z124 Encounter for screening for malignant neoplasm of cervix: Secondary | ICD-10-CM

## 2017-05-17 DIAGNOSIS — Z1231 Encounter for screening mammogram for malignant neoplasm of breast: Secondary | ICD-10-CM

## 2017-05-17 DIAGNOSIS — Z1211 Encounter for screening for malignant neoplasm of colon: Secondary | ICD-10-CM

## 2017-05-17 DIAGNOSIS — J45991 Cough variant asthma: Secondary | ICD-10-CM

## 2017-05-17 DIAGNOSIS — K219 Gastro-esophageal reflux disease without esophagitis: Secondary | ICD-10-CM | POA: Diagnosis not present

## 2017-05-17 DIAGNOSIS — Z23 Encounter for immunization: Secondary | ICD-10-CM

## 2017-05-17 DIAGNOSIS — I700 Atherosclerosis of aorta: Secondary | ICD-10-CM | POA: Diagnosis not present

## 2017-05-17 DIAGNOSIS — T7840XA Allergy, unspecified, initial encounter: Secondary | ICD-10-CM | POA: Insufficient documentation

## 2017-05-17 DIAGNOSIS — R053 Chronic cough: Secondary | ICD-10-CM | POA: Insufficient documentation

## 2017-05-17 DIAGNOSIS — E669 Obesity, unspecified: Secondary | ICD-10-CM | POA: Diagnosis not present

## 2017-05-17 DIAGNOSIS — E2839 Other primary ovarian failure: Secondary | ICD-10-CM | POA: Diagnosis not present

## 2017-05-17 DIAGNOSIS — Z1239 Encounter for other screening for malignant neoplasm of breast: Secondary | ICD-10-CM

## 2017-05-17 LAB — COMPLETE METABOLIC PANEL WITH GFR
ALT: 17 U/L (ref 6–29)
AST: 19 U/L (ref 10–35)
Albumin: 4 g/dL (ref 3.6–5.1)
Alkaline Phosphatase: 99 U/L (ref 33–130)
BUN: 9 mg/dL (ref 7–25)
CHLORIDE: 104 mmol/L (ref 98–110)
CO2: 27 mmol/L (ref 20–31)
Calcium: 9.2 mg/dL (ref 8.6–10.4)
Creat: 0.84 mg/dL (ref 0.50–0.99)
GFR, EST NON AFRICAN AMERICAN: 75 mL/min (ref >=60)
GFR, Est African American: 86 mL/min (ref >=60)
GLUCOSE: 99 mg/dL (ref 65–99)
POTASSIUM: 4.2 mmol/L (ref 3.5–5.3)
SODIUM: 140 mmol/L (ref 135–146)
Total Bilirubin: 0.4 mg/dL (ref 0.2–1.2)
Total Protein: 7.1 g/dL (ref 6.1–8.1)

## 2017-05-17 LAB — CBC WITH DIFFERENTIAL/PLATELET
Basophils Absolute: 57 cells/uL (ref 0–200)
Basophils Relative: 1 %
EOS PCT: 3 %
Eosinophils Absolute: 171 cells/uL (ref 15–500)
HCT: 38.4 % (ref 35.0–45.0)
Hemoglobin: 12 g/dL (ref 11.7–15.5)
LYMPHS PCT: 46 %
Lymphs Abs: 2622 cells/uL (ref 850–3900)
MCH: 27.1 pg (ref 27.0–33.0)
MCHC: 31.3 g/dL — AB (ref 32.0–36.0)
MCV: 86.7 fL (ref 80.0–100.0)
MONOS PCT: 5 %
MPV: 10.5 fL (ref 7.5–12.5)
Monocytes Absolute: 285 cells/uL (ref 200–950)
NEUTROS PCT: 45 %
Neutro Abs: 2565 cells/uL (ref 1500–7800)
Platelets: 325 10*3/uL (ref 140–400)
RBC: 4.43 MIL/uL (ref 3.80–5.10)
RDW: 14.7 % (ref 11.0–15.0)
WBC: 5.7 10*3/uL (ref 4.0–10.5)

## 2017-05-17 LAB — POCT URINALYSIS DIPSTICK
Bilirubin, UA: NEGATIVE
Glucose, UA: NEGATIVE
Ketones, UA: NEGATIVE
Leukocytes, UA: NEGATIVE
Nitrite, UA: NEGATIVE
PH UA: 6 (ref 5.0–8.0)
PROTEIN UA: NEGATIVE
RBC UA: NEGATIVE
SPEC GRAV UA: 1.025 (ref 1.010–1.025)
UROBILINOGEN UA: NEGATIVE U/dL — AB

## 2017-05-17 LAB — LIPID PANEL
CHOL/HDL RATIO: 2.9 ratio (ref <5.0)
Cholesterol: 209 mg/dL — ABNORMAL HIGH (ref <200)
HDL: 72 mg/dL (ref >50)
LDL Cholesterol: 125 mg/dL — ABNORMAL HIGH (ref <100)
TRIGLYCERIDES: 62 mg/dL (ref <150)
VLDL: 12 mg/dL (ref <30)

## 2017-05-17 MED ORDER — PANTOPRAZOLE SODIUM 40 MG PO TBEC: 40.0000 mg | DELAYED_RELEASE_TABLET | Freq: Every day | ORAL | 1 refills | Status: DC

## 2017-05-17 MED ORDER — FAMOTIDINE 20 MG PO TABS: ORAL_TABLET | ORAL | 1 refills | Status: DC

## 2017-05-17 NOTE — Progress Notes (Signed)
Subjective:    Patient ID: Patricia Murphy, female    DOB: 24-Sep-1956, 61 y.o.   MRN: [id number]  HPI Chief Complaint  Patient presents with  . new pt    new pt cpe,constant cough she has had for years. no other concerns.    She is new to the practice and here for a complete physical exam. Previous medical care: [facility] Last CPE: years ago   She has an at least 6 year history of dry cough. States lung disease has been ruled out. She apparently had a bronchoscopy per patient.   Reports post nasal drainage. States she tried [employer]onase and did not like using this. She has tried Medical laboratory scientific officerClaritin and Zytrec and states these did not help.  Has not seen GI.   Temporarily lost her vision in her left eye for a year and states she can see now. She has been taking steroids for at least 2 years. She stopped taking them 3 years ago.   Other providers: Dr. Sherene SiresWert Pulmonologist.   Social history: Lives with her husband and has 4 children, worked in childcare.  Denies smoking, drinking alcohol, drug use  Diet: fairly unhealthy Excerise: nothing   Immunizations: Tdap - more than 10 years ago.   Health maintenance:  Mammogram: 2014  Colonoscopy: more than 10 years ago. Cannot recall where this was done.  Last Gynecological Exam: years ago.  Last Menstrual cycle: age 61  DEXA: never  Last Dental Exam: 2 years ago.  Last Eye Exam: last year   Wears seatbelt always, uses sunscreen, smoke detectors in home and functioning, does not text while driving and feels safe in home environment.   Reviewed allergies, medications, past medical, surgical, family, and social history.   Review of Systems Review of Systems Constitutional: -fever, -chills, -sweats, -unexpected weight change,-fatigue ENT: -runny nose, -ear pain, -sore throat Cardiology:  -chest pain, -palpitations, -edema Respiratory: +cough, -shortness of breath, -wheezing Gastroenterology: -abdominal pain, -nausea, -vomiting, -diarrhea,  -constipation  Hematology: -bleeding or bruising problems Musculoskeletal: -arthralgias, -myalgias, -joint swelling, -back pain Ophthalmology: -vision changes Urology: -dysuria, -difficulty urinating, -hematuria, -urinary frequency, -urgency Neurology: -headache, -weakness, -tingling, -numbness       Objective:   Physical Exam  BP 132/70   Pulse 62   Ht 5' 0.5" (1.537 m)   Wt 179 lb 6.4 oz (81.4 kg)   BMI 34.46 kg/m   General Appearance:    Alert, cooperative, no distress, appears stated age  Head:    Normocephalic, without obvious abnormality, atraumatic  Eyes:    PERRL, conjunctiva/corneas clear, EOM's intact, fundi    benign  Ears:    Normal TM's and external ear canals  Nose:   Nares normal, mucosa normal, no drainage or sinus   tenderness  Throat:   Lips, mucosa, and tongue normal; teeth and gums normal  Neck:   Supple, no lymphadenopathy;  thyroid:  no   enlargement/tenderness/nodules; no carotid   bruit or JVD  Back:    Spine nontender, no curvature, ROM normal, no CVA     tenderness  Lungs:     Clear to auscultation bilaterally without wheezes, rales or     ronchi; respirations unlabored  Chest Wall:    No tenderness or deformity   Heart:    Regular rate and rhythm, S1 and S2 normal, no murmur, rub   or gallop  Breast Exam:    No tenderness, masses, or nipple discharge or inversion.      No axillary lymphadenopathy  Abdomen:     Soft, non-tender, nondistended, normoactive bowel sounds,    no masses, no hepatosplenomegaly  Genitalia:    Normal external genitalia without lesions.  BUS and vagina normal; cervix without lesions, or cervical motion tenderness. No abnormal vaginal discharge.  Uterus and adnexa not enlarged, nontender, no masses.  Pap performed. Chaperone present.   Rectal:    Not done. GI referral made.   Extremities:   No clubbing, cyanosis or edema  Pulses:   2+ and symmetric all extremities  Skin:   Skin color, texture, turgor normal, no rashes or lesions    Lymph nodes:   Cervical, supraclavicular, and axillary nodes normal  Neurologic:   CNII-XII intact, normal strength, sensation and gait; reflexes 2+ and symmetric throughout          Psych:   Normal mood, affect, hygiene and grooming.    Urinalysis dipstick: neg     Assessment & Plan:  Routine general medical examination at a health care facility - Plan: Urinalysis Dipstick, CBC with Differential/Platelet, COMPLETE METABOLIC PANEL WITH GFR, TSH, Lipid panel  Hypertension, unspecified type - Plan: EKG 12-Lead  Chronic cough  Gastroesophageal reflux disease without esophagitis - Plan: Ambulatory referral to Gastroenterology  Thoracic aortic atherosclerosis (HCC) - Plan: EKG 12-Lead, Lipid panel  Need for Tdap vaccination - Plan: Tdap vaccine greater than or equal to 7yo IM  Screening for breast cancer - Plan: MM DIGITAL SCREENING BILATERAL  Estrogen deficiency - Plan: DG Bone Density  Family history of heart disease in female family member before age 60 - Plan: EKG 12-Lead  Screening for cervical cancer - Plan: Cytology - PAP  Obesity (BMI 30-39.9) - Plan: TSH  Screen for colon cancer - Plan: Ambulatory referral to Gastroenterology  Cough variant asthma  vs upper airway cough syndrome  - Plan: famotidine (PEPCID) 20 MG tablet, pantoprazole (PROTONIX) 40 MG tablet  She appears to be doing well overall but is deficient in several areas of health maintenance.  ECG is unremarkable.  HTN- BP is very close to goal. She denies ever being on medication for BP.  Tdap given.  Mammogram and bone density ordered.  Pap smear done.  Referral to GI for colon cancer screening and to discuss GERD. She has been taking Protonix and Pepcid, alternating doses. She will discuss this with GI.  She has been worked up by Dr. Sherene Sires for cough and no lung disease found. Suspect allergies vs GERD.  Discussed risk factors for heart disease and that keeping her BP, cholesterol and blood sugars under  control would help prevent worsening disease. She did have atherosclerosis on XR and made her aware of this and the increased risk it presents.  Consider referral to cardiology due to risk factors for heart disease and family history of heart disease in mother in her early 31s. Will discuss this again at our next visit. Follow up pending labs.

## 2017-05-17 NOTE — Patient Instructions (Addendum)
Call and schedule your mammogram and bone density at the breast center.  You received your Tdap today.  The GI office will call you to schedule an appointment.   We will call you with your lab results.    Preventative Care for Adults - Female      MAINTAIN REGULAR HEALTH EXAMS:  A routine yearly physical is a good way to check in with your primary care provider about your health and preventive screening. It is also an opportunity to share updates about your health and any concerns you have, and receive a thorough all-over exam.   Most health insurance companies pay for at least some preventative services.  Check with your health plan for specific coverages.  WHAT PREVENTATIVE SERVICES DO WOMEN NEED?  Adult women should have their weight and blood pressure checked regularly.   Women age 63 and older should have their cholesterol levels checked regularly.  Women should be screened for cervical cancer with a Pap smear and pelvic exam beginning at either age 63, or 3 years after they become sexually activity.    Breast cancer screening generally begins at age 940 with a mammogram and breast exam by your primary care provider.    Beginning at age 63 and continuing to age 63, women should be screened for colorectal cancer.  Certain people may need continued testing until age 63.  Updating vaccinations is part of preventative care.  Vaccinations help protect against diseases such as the flu.  Osteoporosis is a disease in which the bones lose minerals and strength as we age. Women ages 2065 and over should discuss this with their caregivers, as should women after menopause who have other risk factors.  Lab tests are generally done as part of preventative care to screen for anemia and blood disorders, to screen for problems with the kidneys and liver, to screen for bladder problems, to check blood sugar, and to check your cholesterol level.  Preventative services generally include counseling  about diet, exercise, avoiding tobacco, drugs, excessive alcohol consumption, and sexually transmitted infections.    GENERAL RECOMMENDATIONS FOR GOOD HEALTH:  Healthy diet:  Eat a variety of foods, including fruit, vegetables, animal or vegetable protein, such as meat, fish, chicken, and eggs, or beans, lentils, tofu, and grains, such as rice.  Drink plenty of water daily.  Decrease saturated fat in the diet, avoid lots of red meat, processed foods, sweets, fast foods, and fried foods.  Exercise:  Aerobic exercise helps maintain good heart health. At least 30-40 minutes of moderate-intensity exercise is recommended. For example, a brisk walk that increases your heart rate and breathing. This should be done on most days of the week.   Find a type of exercise or a variety of exercises that you enjoy so that it becomes a part of your daily life.  Examples are running, walking, swimming, water aerobics, and biking.  For motivation and support, explore group exercise such as aerobic class, spin class, Zumba, Yoga,or  martial arts, etc.    Set exercise goals for yourself, such as a certain weight goal, walk or run in a race such as a 5k walk/run.  Speak to your primary care provider about exercise goals.  Disease prevention:  If you smoke or chew tobacco, find out from your caregiver how to quit. It can literally save your life, no matter how long you have been a tobacco user. If you do not use tobacco, never begin.   Maintain a healthy diet and normal  weight. Increased weight leads to problems with blood pressure and diabetes.   The Body Mass Index or BMI is a way of measuring how much of your body is fat. Having a BMI above 27 increases the risk of heart disease, diabetes, hypertension, stroke and other problems related to obesity. Your caregiver can help determine your BMI and based on it develop an exercise and dietary program to help you achieve or maintain this important measurement at a  healthful level.  High blood pressure causes heart and blood vessel problems.  Persistent high blood pressure should be treated with medicine if weight loss and exercise do not work.   Fat and cholesterol leaves deposits in your arteries that can block them. This causes heart disease and vessel disease elsewhere in your body.  If your cholesterol is found to be high, or if you have heart disease or certain other medical conditions, then you may need to have your cholesterol monitored frequently and be treated with medication.   Ask if you should have a cardiac stress test if your history suggests this. A stress test is a test done on a treadmill that looks for heart disease. This test can find disease prior to there being a problem.  Menopause can be associated with physical symptoms and risks. Hormone replacement therapy is available to decrease these. You should talk to your caregiver about whether starting or continuing to take hormones is right for you.   Osteoporosis is a disease in which the bones lose minerals and strength as we age. This can result in serious bone fractures. Risk of osteoporosis can be identified using a bone density scan. Women ages 62 and over should discuss this with their caregivers, as should women after menopause who have other risk factors. Ask your caregiver whether you should be taking a calcium supplement and Vitamin D, to reduce the rate of osteoporosis.   Avoid drinking alcohol in excess (more than two drinks per day).  Avoid use of street drugs. Do not share needles with anyone. Ask for professional help if you need assistance or instructions on stopping the use of alcohol, cigarettes, and/or drugs.  Brush your teeth twice a day with fluoride toothpaste, and floss once a day. Good oral hygiene prevents tooth decay and gum disease. The problems can be painful, unattractive, and can cause other health problems. Visit your dentist for a routine oral and dental check  up and preventive care every 6-12 months.   Look at your skin regularly.  Use a mirror to look at your back. Notify your caregivers of changes in moles, especially if there are changes in shapes, colors, a size larger than a pencil eraser, an irregular border, or development of new moles.  Safety:  Use seatbelts 100% of the time, whether driving or as a passenger.  Use safety devices such as hearing protection if you work in environments with loud noise or significant background noise.  Use safety glasses when doing any work that could send debris in to the eyes.  Use a helmet if you ride a bike or motorcycle.  Use appropriate safety gear for contact sports.  Talk to your caregiver about gun safety.  Use sunscreen with a SPF (or skin protection factor) of 15 or greater.  Lighter skinned people are at a greater risk of skin cancer. Don't forget to also wear sunglasses in order to protect your eyes from too much damaging sunlight. Damaging sunlight can accelerate cataract formation.   Practice safe sex.  Use condoms. Condoms are used for birth control and to help reduce the spread of sexually transmitted infections (or STIs).  Some of the STIs are gonorrhea (the clap), chlamydia, syphilis, trichomonas, herpes, HPV (human papilloma virus) and HIV (human immunodeficiency virus) which causes AIDS. The herpes, HIV and HPV are viral illnesses that have no cure. These can result in disability, cancer and death.   Keep carbon monoxide and smoke detectors in your home functioning at all times. Change the batteries every 6 months or use a model that plugs into the wall.   Vaccinations:  Stay up to date with your tetanus shots and other required immunizations. You should have a booster for tetanus every 10 years. Be sure to get your flu shot every year, since 5%-20% of the [place] population comes down with the flu. The flu vaccine changes each year, so being vaccinated once is not enough. Get your shot in the fall,  before the flu season peaks.   Other vaccines to consider:  Human Papilloma Virus or HPV causes cancer of the cervix, and other infections that can be transmitted from person to person. There is a vaccine for HPV, and females should get immunized between the ages of 59 and 30. It requires a series of 3 shots.   Pneumococcal vaccine to protect against certain types of pneumonia.  This is normally recommended for adults age 90 or older.  However, adults younger than 63 years old with certain underlying conditions such as diabetes, heart or lung disease should also receive the vaccine.  Shingles vaccine to protect against Varicella Zoster if you are older than age 36, or younger than 63 years old with certain underlying illness.  Hepatitis A vaccine to protect against a form of infection of the liver by a virus acquired from food.  Hepatitis B vaccine to protect against a form of infection of the liver by a virus acquired from blood or body fluids, particularly if you work in health care.  If you plan to travel internationally, check with your local health department for specific vaccination recommendations.  Cancer Screening:  Breast cancer screening is essential to preventive care for women. All women age 61 and older should perform a breast self-exam every month. At age 100 and older, women should have their caregiver complete a breast exam each year. Women at ages 42 and older should have a mammogram (x-ray film) of the breasts. Your caregiver can discuss how often you need mammograms.    Cervical cancer screening includes taking a Pap smear (sample of cells examined under a microscope) from the cervix (end of the uterus). It also includes testing for HPV (Human Papilloma Virus, which can cause cervical cancer). Screening and a pelvic exam should begin at age 29, or 3 years after a woman becomes sexually active. Screening should occur every year, with a Pap smear but no HPV testing, up to age 57.  After age 83, you should have a Pap smear every 3 years with HPV testing, if no HPV was found previously.   Most routine colon cancer screening begins at the age of 4. On a yearly basis, doctors may provide special easy to use take-home tests to check for hidden blood in the stool. Sigmoidoscopy or colonoscopy can detect the earliest forms of colon cancer and is life saving. These tests use a small camera at the end of a tube to directly examine the colon. Speak to your caregiver about this at age 46, when routine screening begins (  and is repeated every 5 years unless early forms of pre-cancerous polyps or small growths are found).

## 2017-05-18 LAB — TSH: TSH: 1.29 m[IU]/L

## 2017-05-19 LAB — CYTOLOGY - PAP
Diagnosis: NEGATIVE
HPV (WINDOPATH): NOT DETECTED

## 2017-06-22 ENCOUNTER — Ambulatory Visit (INDEPENDENT_AMBULATORY_CARE_PROVIDER_SITE_OTHER): Payer: 59 | Admitting: Gastroenterology

## 2017-06-22 ENCOUNTER — Encounter: Payer: Self-pay | Admitting: Gastroenterology

## 2017-06-22 ENCOUNTER — Encounter (INDEPENDENT_AMBULATORY_CARE_PROVIDER_SITE_OTHER): Payer: Self-pay

## 2017-06-22 VITALS — BP 142/80 | HR 76 | Ht 60.0 in | Wt 179.5 lb

## 2017-06-22 DIAGNOSIS — K219 Gastro-esophageal reflux disease without esophagitis: Secondary | ICD-10-CM | POA: Diagnosis not present

## 2017-06-22 DIAGNOSIS — IMO0001 Reserved for inherently not codable concepts without codable children: Secondary | ICD-10-CM

## 2017-06-22 DIAGNOSIS — Z1211 Encounter for screening for malignant neoplasm of colon: Secondary | ICD-10-CM

## 2017-06-22 DIAGNOSIS — R1110 Vomiting, unspecified: Secondary | ICD-10-CM

## 2017-06-22 DIAGNOSIS — R1319 Other dysphagia: Secondary | ICD-10-CM | POA: Diagnosis not present

## 2017-06-22 MED ORDER — NA SULFATE-K SULFATE-MG SULF 17.5-3.13-1.6 GM/177ML PO SOLN: 1.0000 | Freq: Once | ORAL | 0 refills | Status: AC

## 2017-06-22 NOTE — Patient Instructions (Addendum)
You have been scheduled for an endoscopy and colonoscopy. Please follow the written instructions given to you at your visit today. Please pick up your prep supplies at the pharmacy within the next 1-3 days. If you use inhalers (even only as needed), please bring them with you on the day of your procedure. Your physician has requested that you go to www.startemmi.com and enter the access code given to you at your visit today. This web site gives a general overview about your procedure. However, you should still follow specific instructions given to you by our office regarding your preparation for the procedure.  Take Zantac 150 mg twice a day

## 2017-06-22 NOTE — Progress Notes (Signed)
Patricia Murphy    [id number]    [birth year]  Primary Care Physician:Henson, Zorita PangVickie L, NP-C  Referring Physician: Avanell ShackletonHenson, Vickie L, NP-C [address] [place], [place] [address]  Chief complaint:  GERD, Cough  HPI: 61 year old female here for new patient evaluation. She has history of chronic cough and also heartburn. She was prescribed Protonix and Pepcid at bedtime, patient took them for a few weeks and has recently discontinued as they were not helping with her symptoms. She has constant cough with some mucus. He has seasonal allergies and also sinus problem with postnasal drip. She takes over-the-counter antihistamine with no significant improvement of postnasal drip or seasonal allergies.  Review of system positive for intermittent dysphagia to mostly solids associated with regurgitation of food and liquids. Denies any nausea or vomiting. She never had an endoscopy. Last colonoscopy was more than 10 years ago No family history of GI malignancy.   Outpatient Encounter Prescriptions as of 06/22/2017  Medication Sig  . chlorpheniramine (WAL-FINATE) 4 MG tablet Take 4 mg by mouth 2 (two) times daily as needed for allergies.  . Multiple Vitamin (MULTIVITAMIN) tablet Take 1 tablet by mouth daily. Reported on 02/01/2016  . famotidine (PEPCID) 20 MG tablet One at bedtime (Patient not taking: Reported on 06/22/2017)  . pantoprazole (PROTONIX) 40 MG tablet Take 1 tablet (40 mg total) by mouth daily. Take 30-60 min before first meal of the day (Patient not taking: Reported on 06/22/2017)   No facility-administered encounter medications on file as of 06/22/2017.     Allergies as of 06/22/2017 - Review Complete 06/22/2017  Allergen Reaction Noted  . Ivp dye [iodinated diagnostic agents] Nausea And Vomiting 08/01/2013  . Penicillins  04/09/2012  . Shellfish allergy  05/17/2017    Past Medical History:  Diagnosis Date  . Allergy   . Chronic cough   . Diabetes mellitus without  complication (HCC)    due to 6 months of steroids, was never high prior to steroid use for retinitis  . GERD (gastroesophageal reflux disease)   . Hypertension   . Thoracic aortic atherosclerosis (HCC)    on XR    History reviewed. No pertinent surgical history.  Family History  Problem Relation Age of Onset  . Heart disease Mother 3052  . Heart disease Father   . Heart disease Maternal Grandmother   . Stroke Sister   . Heart attack Sister   . Heart disease Brother   . Diabetes Brother   . Heart attack Sister   . Heart disease Sister   . Diabetes Brother     Social History   Social History  . Marital status: Married    Spouse name: N/A  . Number of children: 4  . Years of education: N/A   Occupational History  . retired    Social History Main Topics  . Smoking status: Never Smoker  . Smokeless tobacco: Never Used  . Alcohol use No  . Drug use: No  . Sexual activity: Yes    Birth control/ protection: None   Other Topics Concern  . Not on file   Social History Narrative  . No narrative on file      Review of systems: Review of Systems  Constitutional: Negative for fever and chills.  HENT: Negative.   Eyes: Negative for blurred vision.  Respiratory: Negative for cough, shortness of breath and wheezing.   Cardiovascular: Negative for chest pain and palpitations.  Gastrointestinal: as  per HPI Genitourinary: Negative for dysuria, urgency, frequency and hematuria.  Musculoskeletal: Negative for myalgias, back pain and joint pain.  Skin: Negative for itching and rash.  Neurological: Negative for dizziness, tremors, focal weakness, seizures and loss of consciousness.  Endo/Heme/Allergies: Positive for seasonal allergies.  Psychiatric/Behavioral: Negative for depression, suicidal ideas and hallucinations.  All other systems reviewed and are negative.   Physical Exam: Vitals:   06/22/17 1050  BP: (!) 142/80  Pulse: 76   Body mass index is 35.06  kg/m. Gen:      No acute distress HEENT:  EOMI, sclera anicteric Neck:     No masses; no thyromegaly Lungs:    Clear to auscultation bilaterally; normal respiratory effort CV:         Regular rate and rhythm; no murmurs Abd:      + bowel sounds; soft, non-tender; no palpable masses, no distension Ext:    No edema; adequate peripheral perfusion Skin:      Warm and dry; no rash Neuro: alert and oriented x 3 Psych: normal mood and affect  Data Reviewed:  Reviewed labs, radiology imaging, old records and pertinent past GI work up   Assessment and Plan/Recommendations:  61 year old female with history of chronic heartburn, chronic cough, intermittent dysphagia and regurgitation  We'll schedule for EGD to evaluate dysphagia Chronic cough could be secondary to postnasal drip or uncontrolled GERD Patient is reluctant to restart PPI as she felt it was ineffective We will do a trial of ranitidine 150 mg twice daily We will rediscuss management after EGD Advised patient to take antihistamine daily to prevent seasonal allergies and postnasal drip  Due for colorectal cancer screening, schedule colonoscopy along with EGD The risks and benefits as well as alternatives of endoscopic procedure(s) have been discussed and reviewed. All questions answered. The patient agrees to proceed.   Iona Beard , MD [phone] Mon-Fri 8a-5p [phone] after 5p, weekends, holidays  CC: Henson, Vickie L, NP-C

## 2017-07-17 ENCOUNTER — Encounter: Payer: Self-pay | Admitting: Gastroenterology

## 2017-07-23 DIAGNOSIS — Z1211 Encounter for screening for malignant neoplasm of colon: Secondary | ICD-10-CM | POA: Diagnosis not present

## 2017-07-25 ENCOUNTER — Encounter: Payer: Self-pay | Admitting: Gastroenterology

## 2017-07-26 ENCOUNTER — Ambulatory Visit (AMBULATORY_SURGERY_CENTER): Payer: 59 | Admitting: Gastroenterology

## 2017-07-26 ENCOUNTER — Encounter: Payer: Self-pay | Admitting: Gastroenterology

## 2017-07-26 ENCOUNTER — Other Ambulatory Visit: Payer: Self-pay

## 2017-07-26 VITALS — BP 173/94 | HR 83 | Temp 98.9°F | Resp 17 | Ht 61.0 in | Wt 179.0 lb

## 2017-07-26 DIAGNOSIS — K219 Gastro-esophageal reflux disease without esophagitis: Secondary | ICD-10-CM | POA: Diagnosis present

## 2017-07-26 DIAGNOSIS — R1310 Dysphagia, unspecified: Secondary | ICD-10-CM

## 2017-07-26 DIAGNOSIS — R1319 Other dysphagia: Secondary | ICD-10-CM

## 2017-07-26 DIAGNOSIS — K209 Esophagitis, unspecified without bleeding: Secondary | ICD-10-CM

## 2017-07-26 DIAGNOSIS — Z1211 Encounter for screening for malignant neoplasm of colon: Secondary | ICD-10-CM

## 2017-07-26 DIAGNOSIS — K222 Esophageal obstruction: Secondary | ICD-10-CM | POA: Diagnosis not present

## 2017-07-26 DIAGNOSIS — K2210 Ulcer of esophagus without bleeding: Secondary | ICD-10-CM | POA: Diagnosis not present

## 2017-07-26 DIAGNOSIS — Z1212 Encounter for screening for malignant neoplasm of rectum: Secondary | ICD-10-CM | POA: Diagnosis not present

## 2017-07-26 DIAGNOSIS — K449 Diaphragmatic hernia without obstruction or gangrene: Secondary | ICD-10-CM

## 2017-07-26 MED ORDER — OMEPRAZOLE 40 MG PO CPDR: 40.0000 mg | DELAYED_RELEASE_CAPSULE | Freq: Two times a day (BID) | ORAL | 3 refills | Status: DC

## 2017-07-26 MED ORDER — SODIUM CHLORIDE 0.9 % IV SOLN: 500.0000 mL | INTRAVENOUS | Status: AC

## 2017-07-26 NOTE — Progress Notes (Signed)
Called to room to assist during endoscopic procedure.  Patient ID and intended procedure confirmed with present staff. Received instructions for my participation in the procedure from the performing physician.  

## 2017-07-26 NOTE — Op Note (Signed)
[facility] Patient Name: Patricia Murphy Procedure Date: 07/26/2017 1:54 PM MRN: [id number] Endoscopist: Napoleon Form , MD Age: 61 Referring MD:  Date of Birth: [birth year] Gender: Female Account #: [id number] Procedure:                Upper GI endoscopy Indications:              Dysphagia Medicines:                Monitored Anesthesia Care Procedure:                Pre-Anesthesia Assessment:                           - Prior to the procedure, a History and Physical                            was performed, and patient medications and                            allergies were reviewed. The patient's tolerance of                            previous anesthesia was also reviewed. The risks                            and benefits of the procedure and the sedation                            options and risks were discussed with the patient.                            All questions were answered, and informed consent                            was obtained. Prior Anticoagulants: The patient has                            taken no previous anticoagulant or antiplatelet                            agents. ASA Grade Assessment: II - A patient with                            mild systemic disease. After reviewing the risks                            and benefits, the patient was deemed in                            satisfactory condition to undergo the procedure.                           After obtaining informed consent, the endoscope was  passed under direct vision. Throughout the                            procedure, the patient's blood pressure, pulse, and                            oxygen saturations were monitored continuously. The                            Model GIF-HQ190 [id number]) scope was introduced                            through the mouth, and advanced to the second part                            of duodenum. The patient tolerated the  procedure                            well. The upper GI endoscopy was technically                            difficult and complex. Scope In: Scope Out: Findings:                 LA Grade D (one or more mucosal breaks involving at                            least 75% of esophageal circumference) esophagitis                            with bleeding was found 25 to 26 cm from the                            incisors. Biopsies were taken with a cold forceps                            for histology.                           One mild (non-circumferential scarring)                            benign-appearing, intrinsic stenosis was found 25                            to 26 cm from the incisors. This measured 1.7 cm                            (inner diameter) x less than one cm (in length) and                            was traversed. A TTS dilator was passed through the  scope. Dilation with an 18-19-20 mm balloon dilator                            was performed to 20 mm. The dilation site was                            examined following endoscope reinsertion and showed                            moderate improvement in luminal narrowing.                           A 5 cm hiatal hernia was present.                           No gross lesions were noted in the entire examined                            stomach.                           The first portion of the duodenum and second                            portion of the duodenum were normal. Complications:            No immediate complications. Estimated Blood Loss:     Estimated blood loss was minimal. Impression:               - LA Grade D reflux esophagitis. Biopsied.                           - Benign-appearing esophageal stenosis. Dilated.                           - 5 cm hiatal hernia.                           - No gross lesions in the stomach.                           - Normal first portion of the duodenum  and second                            portion of the duodenum. Recommendation:           - Patient has a contact number available for                            emergencies. The signs and symptoms of potential                            delayed complications were discussed with the                            patient. Return to normal  activities tomorrow.                            Written discharge instructions were provided to the                            patient.                           - Resume previous diet.                           - Continue present medications.                           - Await pathology results.                           - Schedule Esophageal manometry                           - Omeprazole 40mg  BID before breakfast and dinner Napoleon FormKavitha V. Nayib Remer, MD 07/26/2017 2:48:31 PM This report has been signed electronically.

## 2017-07-26 NOTE — Op Note (Addendum)
[facility] Patient Name: Patricia Simmeringllen Chiao Procedure Date: 07/26/2017 1:54 PM MRN: [id number] Endoscopist: Napoleon FormKavitha V. Ann Groeneveld , MD Age: [age] Referring MD:  Date of Birth: 1956-05-11 Gender: Female Account #: [id number] Procedure:                Colonoscopy Indications:              Screening for colorectal malignant neoplasm, This                            is the patient's first colonoscopy Medicines:                Monitored Anesthesia Care Procedure:                Pre-Anesthesia Assessment:                           - Prior to the procedure, a History and Physical                            was performed, and patient medications and                            allergies were reviewed. The patient's tolerance of                            previous anesthesia was also reviewed. The risks                            and benefits of the procedure and the sedation                            options and risks were discussed with the patient.                            All questions were answered, and informed consent                            was obtained. Prior Anticoagulants: The patient has                            taken no previous anticoagulant or antiplatelet                            agents. ASA Grade Assessment: II - A patient with                            mild systemic disease. After reviewing the risks                            and benefits, the patient was deemed in                            satisfactory condition to undergo the procedure.  After obtaining informed consent, the colonoscope                            was passed under direct vision. Throughout the                            procedure, the patient's blood pressure, pulse, and                            oxygen saturations were monitored continuously. The                            Colonoscope was introduced through the anus and                            advanced to the the  cecum, identified by                            appendiceal orifice and ileocecal valve. The                            patient tolerated the procedure well. The quality                            of the bowel preparation was excellent. The                            ileocecal valve, appendiceal orifice, and rectum                            were photographed. The colonoscopy was somewhat                            difficult due to constant coughing by patient. Scope In: 2:22:38 PM Scope Out: 2:34:46 PM Scope Withdrawal Time: 0 hours 9 minutes 25 seconds  Total Procedure Duration: 0 hours 12 minutes 8 seconds  Findings:                 The perianal and digital rectal examinations were                            normal.                           Non-bleeding internal hemorrhoids were found during                            retroflexion. The hemorrhoids were small.                            Superficial rectal tear during retroflexion, small                            rectal vault with patient constant coughing caused  technical difficulty                           The exam was otherwise without abnormality. Complications:            No immediate complications. Estimated Blood Loss:     Estimated blood loss was minimal. Impression:               - Non-bleeding internal hemorrhoids.                           - The examination was otherwise normal.                           - No specimens collected. Recommendation:           - Patient has a contact number available for                            emergencies. The signs and symptoms of potential                            delayed complications were discussed with the                            patient. Return to normal activities tomorrow.                            Written discharge instructions were provided to the                            patient.                           - Resume previous diet.                            - Continue present medications.                           - Repeat colonoscopy in 10 years for screening                            purposes. Napoleon Form, MD 07/26/2017 2:40:39 PM This report has been signed electronically.

## 2017-07-26 NOTE — Progress Notes (Signed)
A and O x3. Report to RN. Tolerated MAC anesthesia well.Teeth unchanged after procedure.

## 2017-07-26 NOTE — Patient Instructions (Addendum)
YOU HAD AN ENDOSCOPIC PROCEDURE TODAY AT THE [facility]:   Refer to the procedure report that was given to you for any specific questions about what was found during the examination.  If the procedure report does not answer your questions, please call your gastroenterologist to clarify.  If you requested that your care partner not be given the details of your procedure findings, then the procedure report has been included in a sealed envelope for you to review at your convenience later.  YOU SHOULD EXPECT: Some feelings of bloating in the abdomen. Passage of more gas than usual.  Walking can help get rid of the air that was put into your GI tract during the procedure and reduce the bloating. If you had a lower endoscopy (such as a colonoscopy or flexible sigmoidoscopy) you may notice spotting of blood in your stool or on the toilet paper. If you underwent a bowel prep for your procedure, you may not have a normal bowel movement for a few days.  Please Note:  You might notice some irritation and congestion in your nose or some drainage.  This is from the oxygen used during your procedure.  There is no need for concern and it should clear up in a day or so.  SYMPTOMS TO REPORT IMMEDIATELY:   Following lower endoscopy (colonoscopy or flexible sigmoidoscopy):  Excessive amounts of blood in the stool  Significant tenderness or worsening of abdominal pains  Swelling of the abdomen that is new, acute  Fever of 100F or higher   Following upper endoscopy (EGD)  Vomiting of blood or coffee ground material  New chest pain or pain under the shoulder blades  Painful or persistently difficult swallowing  New shortness of breath  Fever of 100F or higher  Black, tarry-looking stools  For urgent or emergent issues, a gastroenterologist can be reached at any hour by calling [phone].   DIET:  Follow dilatation diet. Drink plenty of fluids but you should avoid alcoholic beverages for 24  hours.  ACTIVITY:  You should plan to take it easy for the rest of today and you should NOT DRIVE or use heavy machinery until tomorrow (because of the sedation medicines used during the test).    FOLLOW UP: Our staff will call the number listed on your records the next business day following your procedure to check on you and address any questions or concerns that you may have regarding the information given to you following your procedure. If we do not reach you, we will leave a message.  However, if you are feeling well and you are not experiencing any problems, there is no need to return our call.  We will assume that you have returned to your regular daily activities without incident.  If any biopsies were taken you will be contacted by phone or by letter within the next 1-3 weeks.  Please call us at [phone] if you have not heard about the biopsies in 3 weeks.   Repeat Colonoscopy in 10 years Hemorrhoids (handout given) Post Esophageal Dilation Diet (handout given) Esophagitis and Stricture (handout given) Omeprazole 40 mg  Twice a day take as prescribed Anusol cream use as bedtime as prescribed    SIGNATURES/CONFIDENTIALITY: You and/or your care partner have signed paperwork which will be entered into your electronic medical record.  These signatures attest to the fact that that the information above on your After Visit Summary has been reviewed and is understood.  Full responsibility of the  confidentiality of this discharge information lies with you and/or your care-partner. 

## 2017-07-27 ENCOUNTER — Telehealth: Payer: Self-pay | Admitting: *Deleted

## 2017-07-27 NOTE — Telephone Encounter (Signed)
  Follow up Call-  Call back number 07/26/2017  Post procedure Call Back phone  # [phone]  Permission to leave phone message No  Some recent data might be hidden     Patient questions:  Do you have a fever, pain , or abdominal swelling? No. Pain Score  0 *  Have you tolerated food without any problems? Yes.    Have you been able to return to your normal activities? Yes.    Do you have any questions about your discharge instructions: Diet   No. Medications  No. Follow up visit  No.  Do you have questions or concerns about your Care? No.  Actions: * If pain score is 4 or above: No action needed, pain <4.

## 2017-07-28 ENCOUNTER — Telehealth: Payer: Self-pay | Admitting: Gastroenterology

## 2017-07-28 NOTE — Telephone Encounter (Signed)
Patient says she was unaware of this test or how to prep. Explained in detail to her from the pamplet on Esophageal Manometry. Confirmed the date and time. Apologized for the confusion and causing her anxiety. She says she will go and thanks me for returning her call.

## 2017-07-31 ENCOUNTER — Encounter (HOSPITAL_COMMUNITY)
Admission: RE | Disposition: A | Discharge status: Home / Self Care | Payer: Self-pay | Source: Ambulatory Visit | Attending: Gastroenterology

## 2017-07-31 ENCOUNTER — Ambulatory Visit (HOSPITAL_COMMUNITY)
Admission: RE | Admit: 2017-07-31 | Discharge: 2017-07-31 | Disposition: A | Discharge status: Home / Self Care | Payer: 59 | Source: Ambulatory Visit | Attending: Gastroenterology | Admitting: Gastroenterology

## 2017-07-31 DIAGNOSIS — R1319 Other dysphagia: Secondary | ICD-10-CM

## 2017-07-31 DIAGNOSIS — K219 Gastro-esophageal reflux disease without esophagitis: Secondary | ICD-10-CM | POA: Insufficient documentation

## 2017-07-31 DIAGNOSIS — R1310 Dysphagia, unspecified: Secondary | ICD-10-CM | POA: Diagnosis not present

## 2017-07-31 DIAGNOSIS — K210 Gastro-esophageal reflux disease with esophagitis: Secondary | ICD-10-CM

## 2017-07-31 HISTORY — PX: ESOPHAGEAL MANOMETRY: SHX5429

## 2017-07-31 SURGERY — MANOMETRY, ESOPHAGUS

## 2017-07-31 MED ORDER — LIDOCAINE VISCOUS 2 % MT SOLN
OROMUCOSAL | Status: AC
  Filled 2017-07-31: qty 15

## 2017-07-31 SURGICAL SUPPLY — 2 items
FACESHIELD LNG OPTICON STERILE (SAFETY) IMPLANT
GLOVE BIO SURGEON STRL SZ8 (GLOVE) ×6 IMPLANT

## 2017-07-31 NOTE — Progress Notes (Signed)
Esophageal Manometry done per protocol.  Patient tolerated well/without complication.  Dr. Nandigam to be notified today.  Onie Hayashi, RN 

## 2017-08-01 ENCOUNTER — Encounter (HOSPITAL_COMMUNITY): Payer: Self-pay | Admitting: Gastroenterology

## 2017-08-02 DIAGNOSIS — R1319 Other dysphagia: Secondary | ICD-10-CM

## 2017-08-02 DIAGNOSIS — R1310 Dysphagia, unspecified: Secondary | ICD-10-CM

## 2017-08-11 ENCOUNTER — Ambulatory Visit
Admission: RE | Admit: 2017-08-11 | Discharge: 2017-08-11 | Disposition: A | Discharge status: Home / Self Care | Payer: 59 | Source: Ambulatory Visit | Attending: Family Medicine | Admitting: Family Medicine

## 2017-08-11 DIAGNOSIS — Z1239 Encounter for other screening for malignant neoplasm of breast: Secondary | ICD-10-CM

## 2017-08-11 DIAGNOSIS — Z13820 Encounter for screening for osteoporosis: Secondary | ICD-10-CM | POA: Diagnosis not present

## 2017-08-11 DIAGNOSIS — Z1231 Encounter for screening mammogram for malignant neoplasm of breast: Secondary | ICD-10-CM | POA: Diagnosis not present

## 2017-08-11 DIAGNOSIS — Z780 Asymptomatic menopausal state: Secondary | ICD-10-CM | POA: Diagnosis not present

## 2017-08-11 DIAGNOSIS — E2839 Other primary ovarian failure: Secondary | ICD-10-CM

## 2017-08-28 ENCOUNTER — Encounter: Payer: Self-pay | Admitting: Gastroenterology

## 2017-08-28 ENCOUNTER — Telehealth: Payer: Self-pay | Admitting: Family Medicine

## 2017-08-28 ENCOUNTER — Encounter (INDEPENDENT_AMBULATORY_CARE_PROVIDER_SITE_OTHER): Payer: Self-pay

## 2017-08-28 ENCOUNTER — Ambulatory Visit (INDEPENDENT_AMBULATORY_CARE_PROVIDER_SITE_OTHER): Payer: 59 | Admitting: Gastroenterology

## 2017-08-28 VITALS — BP 138/88 | HR 80 | Ht 60.0 in | Wt 181.4 lb

## 2017-08-28 DIAGNOSIS — K449 Diaphragmatic hernia without obstruction or gangrene: Secondary | ICD-10-CM

## 2017-08-28 DIAGNOSIS — R05 Cough: Secondary | ICD-10-CM

## 2017-08-28 DIAGNOSIS — R1110 Vomiting, unspecified: Secondary | ICD-10-CM | POA: Diagnosis not present

## 2017-08-28 DIAGNOSIS — K2100 Gastro-esophageal reflux disease with esophagitis, without bleeding: Secondary | ICD-10-CM

## 2017-08-28 DIAGNOSIS — R059 Cough, unspecified: Secondary | ICD-10-CM

## 2017-08-28 DIAGNOSIS — IMO0001 Reserved for inherently not codable concepts without codable children: Secondary | ICD-10-CM

## 2017-08-28 DIAGNOSIS — K224 Dyskinesia of esophagus: Secondary | ICD-10-CM

## 2017-08-28 MED ORDER — FAMOTIDINE 40 MG PO TABS: 40.0000 mg | ORAL_TABLET | Freq: Every day | ORAL | 6 refills | Status: DC

## 2017-08-28 MED ORDER — HYDROCORTISONE 1 % EX OINT: 1.0000 "application " | TOPICAL_OINTMENT | Freq: Two times a day (BID) | CUTANEOUS | 0 refills | Status: DC

## 2017-08-28 MED ORDER — PANTOPRAZOLE SODIUM 40 MG PO TBEC: 40.0000 mg | DELAYED_RELEASE_TABLET | Freq: Two times a day (BID) | ORAL | 11 refills | Status: DC

## 2017-08-28 NOTE — Progress Notes (Signed)
Patricia Murphy    [id number]    10/12/1956  Primary Care Physician:Henson, Zorita Pang, NP-C  Referring Physician: Avanell Shackleton, NP-C [address] [place], [place] [address]  Chief complaint:  Cough, gerd  HPI: 61 year old female with chronic GERD, status post EGD July 2018 with evidence of distal esophageal stricture dilation here for follow-up visit. Patient reports significant improvement of dysphagia after EGD with dilation. She is no longer having trouble swallowing solids but continues to have some difficulty with liquids and also has regurgitation. Esophageal manometry showed ineffective esophageal motility with evidence of hiatal hernia. She is taking PPI twice daily with improvement of heartburn. Continues to have intermittent episodes of cough. Denies any nausea, vomiting, abdominal pain, change in bowel habits, melena or bright red blood per rectum.    Outpatient Encounter Prescriptions as of 08/28/2017  Medication Sig  . Multiple Vitamin (MULTIVITAMIN) tablet Take 1 tablet by mouth daily. Reported on 02/01/2016  . [DISCONTINUED] pantoprazole (PROTONIX) 40 MG tablet Take 1 tablet (40 mg total) by mouth daily. Take 30-60 min before first meal of the day  . famotidine (PEPCID) 40 MG tablet Take 1 tablet (40 mg total) by mouth at bedtime.  . pantoprazole (PROTONIX) 40 MG tablet Take 1 tablet (40 mg total) by mouth 2 (two) times daily before a meal.  . [DISCONTINUED] chlorpheniramine (WAL-FINATE) 4 MG tablet Take 4 mg by mouth 2 (two) times daily as needed for allergies.  . [DISCONTINUED] famotidine (PEPCID) 20 MG tablet One at bedtime (Patient not taking: Reported on 06/22/2017)  . [DISCONTINUED] hydrocortisone (ANUSOL-HC) 25 MG suppository Place 25 mg rectally at bedtime as needed for hemorrhoids or anal itching. Use at bedtime for 3-5 days as needed.  . [DISCONTINUED] omeprazole (PRILOSEC) 40 MG capsule Take 1 capsule (40 mg total) by mouth 2 (two) times daily.    Facility-Administered Encounter Medications as of 08/28/2017  Medication  . 0.9 %  sodium chloride infusion    Allergies as of 08/28/2017 - Review Complete 08/28/2017  Allergen Reaction Noted  . Ivp dye [iodinated diagnostic agents] Nausea And Vomiting 08/01/2013  . Penicillins  04/09/2012  . Shellfish allergy  05/17/2017    Past Medical History:  Diagnosis Date  . Allergy   . Chronic cough   . Diabetes mellitus without complication (HCC)    due to 6 months of steroids, was never high prior to steroid use for retinitis  . GERD (gastroesophageal reflux disease)   . Hypertension   . Thoracic aortic atherosclerosis (HCC)    on XR    Past Surgical History:  Procedure Laterality Date  . ESOPHAGEAL MANOMETRY N/A 07/31/2017   Procedure: ESOPHAGEAL MANOMETRY (EM);  Surgeon: Napoleon Form, MD;  Location: [facility];  Service: Endoscopy;  Laterality: N/A;    Family History  Problem Relation Age of Onset  . Heart disease Mother 57  . Heart disease Father   . Heart disease Maternal Grandmother   . Stroke Sister   . Heart attack Sister   . Heart disease Brother   . Diabetes Brother   . Heart attack Sister   . Heart disease Sister   . Diabetes Brother   . Colon cancer Neg Hx     Social History   Social History  . Marital status: Married    Spouse name: N/A  . Number of children: 4  . Years of education: N/A   Occupational History  . retired  Social History Main Topics  . Smoking status: Never Smoker  . Smokeless tobacco: Never Used  . Alcohol use No  . Drug use: No  . Sexual activity: Yes    Birth control/ protection: None   Other Topics Concern  . Not on file   Social History Narrative  . No narrative on file      Review of systems: Review of Systems  Constitutional: Negative for fever and chills.  HENT: Positive for sinus problems Eyes: Negative for blurred vision.  Respiratory: Positive for cough, negative for shortness of breath and  wheezing.   Cardiovascular: Negative for chest pain and palpitations.  Gastrointestinal: as per HPI Genitourinary: Negative for dysuria, urgency, frequency and hematuria.  Musculoskeletal: Negative for myalgias, back pain and joint pain.  Skin: Negative for itching and rash.  Neurological: Negative for dizziness, tremors, focal weakness, seizures and loss of consciousness.  Endo/Heme/Allergies: Positive for seasonal allergies.  Psychiatric/Behavioral: Negative for depression, suicidal ideas and hallucinations.  All other systems reviewed and are negative.   Physical Exam: Vitals:   08/28/17 0937  BP: 138/88  Pulse: 80   Body mass index is 35.43 kg/m. Gen:      No acute distress HEENT:  EOMI, sclera anicteric Neck:     No masses; no thyromegaly Lungs:    Clear to auscultation bilaterally; normal respiratory effort CV:         Regular rate and rhythm; no murmurs Abd:      + bowel sounds; soft, non-tender; no palpable masses, no distension Ext:    No edema; adequate peripheral perfusion Skin:      Warm and dry; no rash Neuro: alert and oriented x 3 Psych: normal mood and affect  Data Reviewed:  Reviewed labs, radiology imaging, old records and pertinent past GI work up   Assessment and Plan/Recommendations:  61 year old female with chronic cough, GERD, esophageal stricture status post dilation here for follow-up visit Patient has a 5 cm hiatal hernia and weak peristalsis with ineffective esophageal motility on esophageal manometry Continue Protonix twice daily before breakfast and dinner Pepcid at bedtime as needed Discussed in detail antireflux measures  Colorectal cancer screening: Average risk Due for recall colonoscopy in August 2028  25 minutes was spent face-to-face with the patient. Greater than 50% of the time used for counseling as well as treatment plan and follow-up. She had multiple questions which were answered to her satisfaction  K. Scherry Ran ,  MD [phone] Mon-Fri 8a-5p [phone] after 5p, weekends, holidays  CC: Henson, Vickie L, NP-C

## 2017-08-28 NOTE — Telephone Encounter (Signed)
Pt came in and would like to know when she needs to be seen again. Please advise pt at [phone].

## 2017-08-28 NOTE — Telephone Encounter (Signed)
Pt came in and stated that she needed a refill on hydrocortisone cream. We have not filled this for her before but is listed under historical meds. Pt uses [facility] on [address] and can be reached at [phone]. Sending to [facility] due to Vickie being out of the office.

## 2017-08-28 NOTE — Patient Instructions (Signed)
Food Choices for Gastroesophageal Reflux Disease, Adult When you have gastroesophageal reflux disease (GERD), the foods you eat and your eating habits are very important. Choosing the right foods can help ease your discomfort. What guidelines do I need to follow?  Choose fruits, vegetables, whole grains, and low-fat dairy products.  Choose low-fat meat, fish, and poultry.  Limit fats such as oils, salad dressings, butter, nuts, and avocado.  Keep a food diary. This helps you identify foods that cause symptoms.  Avoid foods that cause symptoms. These may be different for everyone.  Eat small meals often instead of 3 large meals a day.  Eat your meals slowly, in a place where you are relaxed.  Limit fried foods.  Cook foods using methods other than frying.  Avoid drinking alcohol.  Avoid drinking large amounts of liquids with your meals.  Avoid bending over or lying down until 2-3 hours after eating. What foods are not recommended? These are some foods and drinks that may make your symptoms worse: Vegetables Tomatoes. Tomato juice. Tomato and spaghetti sauce. Chili peppers. Onion and garlic. Horseradish. Fruits Oranges, grapefruit, and lemon (fruit and juice). Meats High-fat meats, fish, and poultry. This includes hot dogs, ribs, ham, sausage, salami, and bacon. Dairy Whole milk and chocolate milk. Sour cream. Cream. Butter. Ice cream. Cream cheese. Drinks Coffee and tea. Bubbly (carbonated) drinks or energy drinks. Condiments Hot sauce. Barbecue sauce. Sweets/Desserts Chocolate and cocoa. Donuts. Peppermint and spearmint. Fats and Oils High-fat foods. This includes [place] fries and potato chips. Other Vinegar. Strong spices. This includes black pepper, white pepper, red pepper, cayenne, curry powder, cloves, ginger, and chili powder. The items listed above may not be a complete list of foods and drinks to avoid. Contact your dietitian for more information. This  information is not intended to replace advice given to you by your health care provider. Make sure you discuss any questions you have with your health care provider. Document Released: 05/29/2012 Document Revised: 05/05/2016 Document Reviewed: 10/02/2013 Elsevier Interactive Patient Education  2017 Elsevier Inc.    Gastroesophageal Reflux Disease, Adult Normally, food travels down the esophagus and stays in the stomach to be digested. If a person has gastroesophageal reflux disease (GERD), food and stomach acid move back up into the esophagus. When this happens, the esophagus becomes sore and swollen (inflamed). Over time, GERD can make small holes (ulcers) in the lining of the esophagus. Follow these instructions at home: Diet  Follow a diet as told by your doctor. You may need to avoid foods and drinks such as: ? Coffee and tea (with or without caffeine). ? Drinks that contain alcohol. ? Energy drinks and sports drinks. ? Carbonated drinks or sodas. ? Chocolate and cocoa. ? Peppermint and mint flavorings. ? Garlic and onions. ? Horseradish. ? Spicy and acidic foods, such as peppers, chili powder, curry powder, vinegar, hot sauces, and BBQ sauce. ? Citrus fruit juices and citrus fruits, such as oranges, lemons, and limes. ? Tomato-based foods, such as red sauce, chili, salsa, and pizza with red sauce. ? Fried and fatty foods, such as donuts, french fries, potato chips, and high-fat dressings. ? High-fat meats, such as hot dogs, rib eye steak, sausage, ham, and bacon. ? High-fat dairy items, such as whole milk, butter, and cream cheese.  Eat small meals often. Avoid eating large meals.  Avoid drinking large amounts of liquid with your meals.  Avoid eating meals during the 2-3 hours before bedtime.  Avoid lying down right after you eat.  Do not exercise right after you eat. General instructions  Pay attention to any changes in your symptoms.  Take over-the-counter and  prescription medicines only as told by your doctor. Do not take aspirin, ibuprofen, or other NSAIDs unless your doctor says it is okay.  Do not use any tobacco products, including cigarettes, chewing tobacco, and e-cigarettes. If you need help quitting, ask your doctor.  Wear loose clothes. Do not wear anything tight around your waist.  Raise (elevate) the head of your bed about 6 inches (15 cm).  Try to lower your stress. If you need help doing this, ask your doctor.  If you are overweight, lose an amount of weight that is healthy for you. Ask your doctor about a safe weight loss goal.  Keep all follow-up visits as told by your doctor. This is important. Contact a doctor if:  You have new symptoms.  You lose weight and you do not know why it is happening.  You have trouble swallowing, or it hurts to swallow.  You have wheezing or a cough that keeps happening.  Your symptoms do not get better with treatment.  You have a hoarse voice. Get help right away if:  You have pain in your arms, neck, jaw, teeth, or back.  You feel sweaty, dizzy, or light-headed.  You have chest pain or shortness of breath.  You throw up (vomit) and your throw up looks like blood or coffee grounds.  You pass out (faint).  Your poop (stool) is bloody or black.  You cannot swallow, drink, or eat. This information is not intended to replace advice given to you by your health care provider. Make sure you discuss any questions you have with your health care provider. Document Released: 05/16/2008 Document Revised: 05/05/2016 Document Reviewed: 03/25/2015 Elsevier Interactive Patient Education  [employer].   We will send in protonix and pepcid to your pharmacy

## 2017-08-28 NOTE — Telephone Encounter (Signed)
Called in hydrocortisone cream. Patricia Murphy

## 2017-08-28 NOTE — Telephone Encounter (Signed)
Go ahead and renew that

## 2017-08-30 ENCOUNTER — Telehealth: Payer: Self-pay | Admitting: Family Medicine

## 2017-08-30 MED ORDER — TRIAMCINOLONE ACETONIDE 0.1 % EX CREA: 1.0000 "application " | TOPICAL_CREAM | Freq: Two times a day (BID) | CUTANEOUS | 0 refills | Status: AC

## 2017-08-30 NOTE — Telephone Encounter (Signed)
Let's her have come back for an office visit in October for chronic health conditions unless she is having issues and then she can come in sooner.

## 2017-08-30 NOTE — Telephone Encounter (Signed)
Pt called and stated that she inadvertently told us the wrong ointment. When she went to pick up she realized it was the wrong ointment and did not pick up. What she would like is Kenalog.

## 2017-08-30 NOTE — Telephone Encounter (Signed)
Sent in kenalog cream

## 2017-08-30 NOTE — Telephone Encounter (Signed)
Called pt and left message to call our office. 

## 2017-08-30 NOTE — Telephone Encounter (Signed)
Please handle this for her.  

## 2017-09-25 ENCOUNTER — Ambulatory Visit: Payer: Commercial Managed Care - HMO | Admitting: Family Medicine

## 2017-11-13 DIAGNOSIS — L308 Other specified dermatitis: Secondary | ICD-10-CM | POA: Diagnosis not present

## 2018-01-31 ENCOUNTER — Encounter: Payer: Self-pay | Admitting: Family Medicine

## 2018-01-31 ENCOUNTER — Ambulatory Visit: Payer: 59 | Admitting: Family Medicine

## 2018-01-31 VITALS — BP 140/80 | HR 70 | Temp 97.9°F | Resp 16 | Wt 184.2 lb

## 2018-01-31 DIAGNOSIS — R062 Wheezing: Secondary | ICD-10-CM

## 2018-01-31 DIAGNOSIS — R6889 Other general symptoms and signs: Secondary | ICD-10-CM

## 2018-01-31 DIAGNOSIS — R059 Cough, unspecified: Secondary | ICD-10-CM

## 2018-01-31 DIAGNOSIS — J069 Acute upper respiratory infection, unspecified: Secondary | ICD-10-CM | POA: Diagnosis not present

## 2018-01-31 DIAGNOSIS — R05 Cough: Secondary | ICD-10-CM

## 2018-01-31 LAB — POC INFLUENZA A&B (BINAX/QUICKVUE)
INFLUENZA A, POC: NEGATIVE
Influenza B, POC: NEGATIVE

## 2018-01-31 MED ORDER — PREDNISONE 10 MG (21) PO TBPK: ORAL_TABLET | Freq: Every day | ORAL | 0 refills | Status: DC

## 2018-01-31 MED ORDER — BENZONATATE 200 MG PO CAPS: 200.0000 mg | ORAL_CAPSULE | Freq: Two times a day (BID) | ORAL | 0 refills | Status: DC | PRN

## 2018-01-31 NOTE — Progress Notes (Signed)
  Subjective:  Patricia Murphy is a 62 y.o. female who presents for possible influenza.  Symptoms include: congestion, cough and chills and body aches. Onset of symptoms was 2 days ago, and have been unchanged since that time. Associated symptoms include: frontal headache, right sided sinus pressure, sore throat and post nasal drainage.   Patient denies: ear pain, dizziness, palpiations, abdominal pain, diarrhea. She is not drinking much.  Treatment to date: homeopathic pills.  ? sick contacts.  No other aggravating or relieving factors.  No other c/o. She is not a smoker.  Denies history of pneumonia, bronchitis or asthma.   The following portions of the patient's history were reviewed and updated as appropriate: allergies, current medications, past medical history, past social history and problem list.  ROS as in subjective   Past Medical History:  Diagnosis Date  . Allergy   . Chronic cough   . Diabetes mellitus without complication (HCC)    due to 6 months of steroids, was never high prior to steroid use for retinitis  . GERD (gastroesophageal reflux disease)   . Hypertension   . Thoracic aortic atherosclerosis (HCC)    on XR     Objective: BP 140/80   Pulse 70   Temp 97.9 F (36.6 C) (Oral)   Resp 16   Wt 184 lb 3.2 oz (83.6 kg)   SpO2 97%   BMI 35.97 kg/m   General: Ill-appearing, well-developed, well-nourished Skin: warm, dry HEENT: Nose inflamed and congested, clear conjunctiva, TMs pearly, right maxillary sinus tenderness, pharynx with erythema, no exudates Neck: Supple, non tender, shotty cervical adenopathy Heart: Regular rate and rhythm, normal S1, S2, no murmurs Lungs:  mild expiratory distant wheezes, no rales, rhonchi. Normal work of breathing Abdomen: Non tender non distended Extremities: no edema, normal pulses.    Assessment: Encounter Diagnoses  Name Primary?  . Flu-like symptoms Yes  . Cough   . Wheezing on expiration   . Acute URI       Plan: Negative flu swab.  Prescription given for steroid dose pak and Tessalon.   Discussed diagnosis of acute URI and cough with wheezing.  Discussed supportive care including rest, hydration, OTC Tylenol or NSAID for fever, aches, and malaise.   If worse or not improving within the next 4-5 days, then call or return.  Patient voiced understanding of diagnosis, recommendations, and treatment plan.  After visit summary given.

## 2018-01-31 NOTE — Patient Instructions (Signed)
Take the steroid dose pak as prescribed. It is a tapering dose.  This may keep you awake at night so take it in the morning and increase your fluids.   Try the Tessalon for cough.  You may also try Mucinex during the day to help with chest congestion and cough.   Use salt water gargles for sore throat.   Tylenol or ibuprofen for body aches, headaches.   Let me know if you are not back to baseline after finishing the antibiotic or if you are getting worse.

## 2018-02-09 ENCOUNTER — Telehealth: Payer: Self-pay

## 2018-02-09 MED ORDER — AZITHROMYCIN 250 MG PO TABS: ORAL_TABLET | ORAL | 0 refills | Status: DC

## 2018-02-09 NOTE — Telephone Encounter (Signed)
Please call and let her know that we can put her on an antibiotic since she is not improving.  As far as I can tell we did not put her on antibiotic we only did steroids and Tessalon.  Please check with her and make sure there is no allergy to Zithromax or a Z-Pak.  We can send this in for her if not, and she would take 2 tablets on day 1 then 1 tablet on days 2 through 5.  Number 6 tablets no refills.

## 2018-02-09 NOTE — Telephone Encounter (Signed)
Pt was notified.  

## 2018-02-09 NOTE — Telephone Encounter (Signed)
Pt called to let you know that her cough is not getting better. Cough is causing her to have side pain and it is producing yellow mucus. Please advise . KH

## 2018-02-12 DIAGNOSIS — J209 Acute bronchitis, unspecified: Secondary | ICD-10-CM | POA: Diagnosis not present

## 2018-02-12 DIAGNOSIS — Z91013 Allergy to seafood: Secondary | ICD-10-CM | POA: Diagnosis not present

## 2018-02-12 DIAGNOSIS — R05 Cough: Secondary | ICD-10-CM | POA: Diagnosis not present

## 2018-02-12 DIAGNOSIS — Z880 Allergy status to penicillin: Secondary | ICD-10-CM | POA: Diagnosis not present

## 2018-02-19 ENCOUNTER — Inpatient Hospital Stay: Payer: 59 | Admitting: Family Medicine

## 2018-02-22 ENCOUNTER — Ambulatory Visit: Payer: 59 | Admitting: Family Medicine

## 2018-02-22 ENCOUNTER — Encounter: Payer: Self-pay | Admitting: Family Medicine

## 2018-02-22 VITALS — BP 140/80 | HR 100 | Temp 97.8°F | Resp 16 | Ht 60.0 in | Wt 181.4 lb

## 2018-02-22 DIAGNOSIS — R05 Cough: Secondary | ICD-10-CM | POA: Diagnosis not present

## 2018-02-22 DIAGNOSIS — K219 Gastro-esophageal reflux disease without esophagitis: Secondary | ICD-10-CM

## 2018-02-22 DIAGNOSIS — J309 Allergic rhinitis, unspecified: Secondary | ICD-10-CM

## 2018-02-22 DIAGNOSIS — R053 Chronic cough: Secondary | ICD-10-CM

## 2018-02-22 MED ORDER — FLUTICASONE PROPIONATE 50 MCG/ACT NA SUSP: 2.0000 | Freq: Every day | NASAL | 6 refills | Status: DC

## 2018-02-22 MED ORDER — MONTELUKAST SODIUM 10 MG PO TABS: 10.0000 mg | ORAL_TABLET | Freq: Every day | ORAL | 1 refills | Status: DC

## 2018-02-22 MED ORDER — LEVOCETIRIZINE DIHYDROCHLORIDE 5 MG PO TABS: 5.0000 mg | ORAL_TABLET | Freq: Every evening | ORAL | 1 refills | Status: DC

## 2018-02-22 NOTE — Progress Notes (Signed)
   Subjective:    Patient ID: Patricia Murphy, female    DOB: Nov 13, 1956, 62 y.o.   MRN: [id number]  HPI Chief Complaint  Patient presents with  . Follow-up    diagnosed with bronchitis and back hurts when she coughs. BP was elevated while there.    She is here to follow up on recent bronchitis diagnosis. States she is not feeling much better after treatment. Continue having a dry cough. Denies fever, chills, headache, rhinorrhea, nasal congestion, sinus pressure, ear pain, sore throat, chest pain, palpitations, shortness of breath, wheezing, abdominal pain, N/V/D, LE edema.  History of underlying allergies and is not currently taking allergy medication.   The ED put her on high dose steroids which she has completed, Tussionex, and ventolin inhaler. She reports feeling slightly better.  Negative chest XR in the ED.   She has seen a pulmonologist in the past and dry cough was thought to be GERD related and not asthma.   She saw GI last year and had esophagous dilated. She is taking reflux medication daily.   Reviewed allergies, medications, past medical, surgical, family, and social history.   Review of Systems Pertinent positives and negatives in the history of present illness.     Objective:   Physical Exam  Constitutional: She is oriented to person, place, and time. She appears well-developed and well-nourished. She does not have a sickly appearance. No distress.  HENT:  Right Ear: Tympanic membrane and ear canal normal.  Left Ear: Tympanic membrane and ear canal normal.  Nose: Mucosal edema and rhinorrhea present. Right sinus exhibits no maxillary sinus tenderness and no frontal sinus tenderness. Left sinus exhibits no maxillary sinus tenderness and no frontal sinus tenderness.  Mouth/Throat: Uvula is midline, oropharynx is clear and moist and mucous membranes are normal.  Eyes: Conjunctivae and lids are normal. Pupils are equal, round, and reactive to light.  Neck: Full  passive range of motion without pain. Neck supple.  Cardiovascular: Normal rate, regular rhythm, normal heart sounds and intact distal pulses.  No LE edema   Pulmonary/Chest: Effort normal and breath sounds normal.  Abdominal: Soft. Normal appearance.  Lymphadenopathy:    She has no cervical adenopathy.  Neurological: She is alert and oriented to person, place, and time.  Skin: Skin is warm and dry. No cyanosis. No pallor.   BP 140/80   Pulse 100   Temp 97.8 F (36.6 C) (Tympanic)   Resp 16   Ht 5' (1.524 m)   Wt 181 lb 6.4 oz (82.3 kg)   SpO2 98%   BMI 35.43 kg/m      Assessment & Plan:  Allergic rhinitis, unspecified seasonality, unspecified trigger - Plan: levocetirizine (XYZAL) 5 MG tablet, fluticasone (FLONASE) 50 MCG/ACT nasal spray, montelukast (SINGULAIR) 10 MG tablet  Persistent dry cough - Plan: levocetirizine (XYZAL) 5 MG tablet, fluticasone (FLONASE) 50 MCG/ACT nasal spray, montelukast (SINGULAIR) 10 MG tablet  Gastroesophageal reflux disease, esophagitis presence not specified  Reviewed notes and chest XR from the ED. Suspect underlying allergies is the biggest contributor to her dry cough. Will have her try Flonase, Xyzal and Singulair.  She will continue on PPI and Pepcid for now, this was prescribed by her GI. Also discussed GERD management.  Reviewed notes from Dr. Sherene SiresWert in 2017 and no sign of asthma at that time.  Will have her follow up in 2 weeks or sooner if she worsens.

## 2018-02-22 NOTE — Patient Instructions (Addendum)
We need to treat your allergies and reflux.  Start taking Xyzal once daily, Singulair every evening and using Flonase nasal spray daily.  Continue taking Protonix and Pepcid for your acid reflux.  Try to avoid foods that make this worse.  Do not eat and lay down for at least 2 hours.    Follow-up in 2 weeks

## 2018-03-07 ENCOUNTER — Encounter: Payer: Self-pay | Admitting: Family Medicine

## 2018-03-07 ENCOUNTER — Ambulatory Visit: Payer: 59 | Admitting: Family Medicine

## 2018-03-07 VITALS — BP 140/80 | HR 81 | Temp 98.0°F | Resp 16 | Wt 187.0 lb

## 2018-03-07 DIAGNOSIS — R05 Cough: Secondary | ICD-10-CM | POA: Diagnosis not present

## 2018-03-07 DIAGNOSIS — J309 Allergic rhinitis, unspecified: Secondary | ICD-10-CM

## 2018-03-07 DIAGNOSIS — K219 Gastro-esophageal reflux disease without esophagitis: Secondary | ICD-10-CM

## 2018-03-07 DIAGNOSIS — R053 Chronic cough: Secondary | ICD-10-CM

## 2018-03-07 NOTE — Progress Notes (Signed)
Chief Complaint  Patient presents with  . follow-up on cough    follow-up, hoarse, still have a cough    Subjective:  Patricia Murphy is a 62 y.o. female who presents for a 2 week follow up on dry cough. She reports using Flonase daily, Xyzal and Singulair daily as well her famotidine and omeprazole daily as prescribed and her is not any better.  No new symptoms.   [facility] at [facility] on 02/13/2018.  She was treated with high dose steroids, antibiotic and inhalers at that time.   She saw Dr. Sherene SiresWert in 2017 and asthma was ruled out at that time.   Denies fever, chills, rhinorrhea, nasal congestion, ear pain, sore throat, chest pain, palpitations, shortness of breath,   No other aggravating or relieving factors.  No other c/o.  ROS as in subjective.   Objective: Vitals:   03/07/18 1024  BP: 140/80  Pulse: 81  Resp: 16  Temp: 98 F (36.7 C)  SpO2: 99%    General appearance: Alert, WD/WN, no distress, well appearing                             Skin: warm, no rash                           Head: no sinus tenderness                            Eyes: conjunctiva normal, corneas clear, PERRLA                            Ears: pearly TMs, external ear canals normal                          Nose: septum midline, turbinates swollen, with erythema and clear discharge             Mouth/throat: MMM, tongue normal, mild pharyngeal erythema                           Neck: supple, no adenopathy, no thyromegaly, nontender                          Heart: RRR, normal S1, S2, no murmurs                         Lungs: CTA bilaterally, no wheezes, rales, or rhonchi      Assessment: Persistent dry cough  Allergic rhinitis, unspecified seasonality, unspecified trigger  Gastroesophageal reflux disease without esophagitis    Plan: Plan to send her back to pulmonologist for re-evaluation of persistent dry cough. Cough is not improving on medications for GERD and allergies.  Unable  to perform PFTs in our office due to machine being under repair.

## 2018-03-07 NOTE — Patient Instructions (Signed)
Continue treating allergies and reflux.   I am sending you back to the pulmonologist for further evaluation since we have not been able to determine the underlying reason for your persistent cough in spite of treating your allergies and GERD.   Call and schedule with [facility]. You were last there in 2017.  [address], 2nd floor [place] [phone]

## 2018-03-20 ENCOUNTER — Other Ambulatory Visit: Payer: Self-pay | Admitting: Family Medicine

## 2018-03-20 DIAGNOSIS — R062 Wheezing: Secondary | ICD-10-CM

## 2018-03-20 DIAGNOSIS — R059 Cough, unspecified: Secondary | ICD-10-CM

## 2018-03-20 DIAGNOSIS — R05 Cough: Secondary | ICD-10-CM

## 2018-03-20 NOTE — Telephone Encounter (Signed)
Pt would like a refill on this. She is 95% better but the cough perles did help a lot but she still has a slight cough. She would like a refill on tussionex if possible.

## 2018-03-20 NOTE — Telephone Encounter (Signed)
I can refill the Tessalon since these helped her cough. Has she schedule with the pulmonologist? I don't think we should have her continue Tussionex since this is a narcotic and controlled substance.

## 2018-03-21 NOTE — Telephone Encounter (Signed)
Pt was notified. She will be calling to schedule with pulmonology

## 2018-05-14 ENCOUNTER — Other Ambulatory Visit: Payer: Self-pay | Admitting: Family Medicine

## 2018-05-14 DIAGNOSIS — R053 Chronic cough: Secondary | ICD-10-CM

## 2018-05-14 DIAGNOSIS — R05 Cough: Secondary | ICD-10-CM

## 2018-05-14 DIAGNOSIS — J309 Allergic rhinitis, unspecified: Secondary | ICD-10-CM

## 2018-05-14 NOTE — Telephone Encounter (Signed)
Is this okay to refill? 

## 2018-06-14 ENCOUNTER — Other Ambulatory Visit: Payer: Self-pay | Admitting: Family Medicine

## 2018-06-14 DIAGNOSIS — R05 Cough: Secondary | ICD-10-CM

## 2018-06-14 DIAGNOSIS — J309 Allergic rhinitis, unspecified: Secondary | ICD-10-CM

## 2018-06-14 DIAGNOSIS — R053 Chronic cough: Secondary | ICD-10-CM

## 2018-08-03 DIAGNOSIS — L308 Other specified dermatitis: Secondary | ICD-10-CM | POA: Diagnosis not present

## 2018-09-21 DIAGNOSIS — L308 Other specified dermatitis: Secondary | ICD-10-CM | POA: Diagnosis not present

## 2018-09-21 DIAGNOSIS — B360 Pityriasis versicolor: Secondary | ICD-10-CM | POA: Diagnosis not present

## 2018-11-01 ENCOUNTER — Other Ambulatory Visit: Payer: Self-pay | Admitting: Gastroenterology

## 2018-11-06 ENCOUNTER — Other Ambulatory Visit: Payer: Self-pay | Admitting: Gastroenterology

## 2018-11-06 DIAGNOSIS — Z1211 Encounter for screening for malignant neoplasm of colon: Secondary | ICD-10-CM

## 2018-11-06 DIAGNOSIS — K219 Gastro-esophageal reflux disease without esophagitis: Secondary | ICD-10-CM

## 2018-11-14 ENCOUNTER — Encounter: Payer: Self-pay | Admitting: Family Medicine

## 2018-11-14 ENCOUNTER — Ambulatory Visit: Payer: 59 | Admitting: Family Medicine

## 2018-11-14 VITALS — BP 140/70 | HR 75 | Temp 98.5°F | Wt 185.2 lb

## 2018-11-14 DIAGNOSIS — R822 Biliuria: Secondary | ICD-10-CM

## 2018-11-14 DIAGNOSIS — R05 Cough: Secondary | ICD-10-CM

## 2018-11-14 DIAGNOSIS — M545 Low back pain, unspecified: Secondary | ICD-10-CM

## 2018-11-14 DIAGNOSIS — J309 Allergic rhinitis, unspecified: Secondary | ICD-10-CM

## 2018-11-14 DIAGNOSIS — R300 Dysuria: Secondary | ICD-10-CM | POA: Diagnosis not present

## 2018-11-14 DIAGNOSIS — R053 Chronic cough: Secondary | ICD-10-CM

## 2018-11-14 DIAGNOSIS — R8290 Unspecified abnormal findings in urine: Secondary | ICD-10-CM

## 2018-11-14 LAB — POCT URINALYSIS DIP (PROADVANTAGE DEVICE)
Blood, UA: NEGATIVE
GLUCOSE UA: NEGATIVE mg/dL
LEUKOCYTES UA: NEGATIVE
NITRITE UA: NEGATIVE
Specific Gravity, Urine: 1.03
Urobilinogen, Ur: NEGATIVE
pH, UA: 5.5 (ref 5.0–8.0)

## 2018-11-14 MED ORDER — NITROFURANTOIN MONOHYD MACRO 100 MG PO CAPS: 100.0000 mg | ORAL_CAPSULE | Freq: Two times a day (BID) | ORAL | 0 refills | Status: AC

## 2018-11-14 MED ORDER — FAMOTIDINE 40 MG PO TABS: ORAL_TABLET | ORAL | 2 refills | Status: AC

## 2018-11-14 MED ORDER — FLUTICASONE PROPIONATE 50 MCG/ACT NA SUSP: 2.0000 | Freq: Every day | NASAL | 2 refills | Status: DC

## 2018-11-14 NOTE — Progress Notes (Signed)
Subjective:  Patricia Murphy is a 62 y.o. female who complains of possible urinary tract infection.  She has had symptoms for 6 days.  Symptoms include malodorous urine, leakage with sneezing, dysuria, and right flank pain. Patient denies fever, chills, abdominal pain, N/V/D. No vaginal discharge. No numbness, tingling or weakness in lower extremities.  Last UTI was unknown.   Using nothing for current symptoms.    Patient does not have a history of recurrent UTI. Patient does not have a history of pyelonephritis.  No other aggravating or relieving factors.  No other c/o.  Past Medical History:  Diagnosis Date  . Allergy   . Chronic cough   . Diabetes mellitus without complication (HCC)    due to 6 months of steroids, was never high prior to steroid use for retinitis  . GERD (gastroesophageal reflux disease)   . Hypertension   . Thoracic aortic atherosclerosis (HCC)    on XR    ROS as in subjective  Reviewed allergies, medications, past medical, surgical, and social history.    Objective: Vitals:   11/14/18 1145  BP: 140/70  Pulse: 75  Temp: 98.5 F (36.9 C)  SpO2: 98%    General appearance: alert, no distress, WD/WN, female Abdomen: +bs, soft, non tender, non distended, no masses, no hepatomegaly, no splenomegaly, no bruits Back: no CVA tenderness GU: declines      Laboratory:  Urine dipstick: trace for ketones, trace for protein and 2+ for urobilinogen.       Assessment: Malodorous urine - Plan: Urine Culture  Right-sided low back pain without sciatica, unspecified chronicity - Plan: POCT Urinalysis DIP (Proadvantage Device), CBC with Differential/Platelet, Comprehensive metabolic panel  Burning with urination - Plan: POCT Urinalysis DIP (Proadvantage Device), Urine Culture  Bilirubin in urine - Plan: CBC with Differential/Platelet, Comprehensive metabolic panel, Urine Culture  Persistent dry cough - Plan: fluticasone (FLONASE) 50 MCG/ACT nasal spray  Allergic  rhinitis, unspecified seasonality, unspecified trigger - Plan: fluticasone (FLONASE) 50 MCG/ACT nasal spray    Plan: Discussed symptoms, diagnosis, possible complications, and usual course of illness.  Begin Macrobid as discussed.  Advised increased water intake, can use OTC Tylenol for pain.  I also recommend heat to her right low back. Back pain is most likely MSK related and no associated with urinary symptoms.    Urine culture sent. Labs checked CBC, CMP due to bilirubin in urine.    Call or return if worse or not improving.  Follow up in 2 weeks for urine recheck.  She wanted to discuss other chronic health conditions at the conclusion of her visit. Requested refills for GERD and Flonase for allergies. Refills sent.  She also wanted to discuss her hands peeling and she is seeing Dr. Margo AyeHall for this. Recommend she return so that we may have more time to discuss these other concerns and to call Dr. Margo AyeHall regarding the treatment for her hands.

## 2018-11-14 NOTE — Patient Instructions (Signed)
Use heat and try Tylenol for your low back pain. This may not be related to your urinary symptoms.  Stay well hydrated.

## 2018-11-15 ENCOUNTER — Other Ambulatory Visit: Payer: Self-pay | Admitting: Internal Medicine

## 2018-11-15 LAB — CBC WITH DIFFERENTIAL/PLATELET
Basophils Absolute: 0 10*3/uL (ref 0.0–0.2)
Basos: 1 %
EOS (ABSOLUTE): 0.2 10*3/uL (ref 0.0–0.4)
EOS: 3 %
HEMATOCRIT: 35.8 % (ref 34.0–46.6)
HEMOGLOBIN: 11.6 g/dL (ref 11.1–15.9)
IMMATURE GRANS (ABS): 0 10*3/uL (ref 0.0–0.1)
IMMATURE GRANULOCYTES: 0 %
LYMPHS: 42 %
Lymphocytes Absolute: 2.1 10*3/uL (ref 0.7–3.1)
MCH: 27.7 pg (ref 26.6–33.0)
MCHC: 32.4 g/dL (ref 31.5–35.7)
MCV: 85 fL (ref 79–97)
Monocytes Absolute: 0.3 10*3/uL (ref 0.1–0.9)
Monocytes: 7 %
NEUTROS PCT: 47 %
Neutrophils Absolute: 2.4 10*3/uL (ref 1.4–7.0)
Platelets: 264 10*3/uL (ref 150–450)
RBC: 4.19 x10E6/uL (ref 3.77–5.28)
RDW: 14.2 % (ref 12.3–15.4)
WBC: 5.1 10*3/uL (ref 3.4–10.8)

## 2018-11-15 LAB — URINE CULTURE: Organism ID, Bacteria: NO GROWTH

## 2018-11-15 LAB — COMPREHENSIVE METABOLIC PANEL
A/G RATIO: 1.5 (ref 1.2–2.2)
ALT: 12 IU/L (ref 0–32)
AST: 12 IU/L (ref 0–40)
Albumin: 4.1 g/dL (ref 3.6–4.8)
Alkaline Phosphatase: 104 IU/L (ref 39–117)
BUN/Creatinine Ratio: 11 — ABNORMAL LOW (ref 12–28)
BUN: 10 mg/dL (ref 8–27)
Bilirubin Total: 0.3 mg/dL (ref 0.0–1.2)
CO2: 24 mmol/L (ref 20–29)
Calcium: 9.5 mg/dL (ref 8.7–10.3)
Chloride: 103 mmol/L (ref 96–106)
Creatinine, Ser: 0.87 mg/dL (ref 0.57–1.00)
GFR, EST AFRICAN AMERICAN: 81 mL/min/{1.73_m2} (ref >59)
GFR, EST NON AFRICAN AMERICAN: 71 mL/min/{1.73_m2} (ref >59)
GLOBULIN, TOTAL: 2.8 g/dL (ref 1.5–4.5)
Glucose: 109 mg/dL — ABNORMAL HIGH (ref 65–99)
POTASSIUM: 4.2 mmol/L (ref 3.5–5.2)
SODIUM: 141 mmol/L (ref 134–144)
TOTAL PROTEIN: 6.9 g/dL (ref 6.0–8.5)

## 2018-11-15 MED ORDER — LEVOCETIRIZINE DIHYDROCHLORIDE 5 MG PO TABS: 5.0000 mg | ORAL_TABLET | Freq: Every evening | ORAL | 2 refills | Status: AC

## 2018-12-03 ENCOUNTER — Ambulatory Visit: Payer: 59 | Admitting: Family Medicine

## 2019-03-08 ENCOUNTER — Other Ambulatory Visit: Payer: Self-pay | Admitting: Family Medicine

## 2019-03-08 DIAGNOSIS — R053 Chronic cough: Secondary | ICD-10-CM

## 2019-03-08 DIAGNOSIS — R05 Cough: Secondary | ICD-10-CM

## 2019-03-08 DIAGNOSIS — J309 Allergic rhinitis, unspecified: Secondary | ICD-10-CM

## 2019-07-11 ENCOUNTER — Other Ambulatory Visit: Payer: Self-pay

## 2019-07-11 DIAGNOSIS — Z20822 Contact with and (suspected) exposure to covid-19: Secondary | ICD-10-CM

## 2019-07-13 ENCOUNTER — Encounter: Payer: Self-pay | Admitting: Family Medicine

## 2019-07-13 ENCOUNTER — Telehealth: Payer: Self-pay

## 2019-07-13 LAB — NOVEL CORONAVIRUS, NAA: SARS-CoV-2, NAA: DETECTED — AB

## 2019-07-13 NOTE — Progress Notes (Unsigned)
I just received a call from [facility] indicating that she was culture positive.  She did have symptoms of cough, sore throat, headache and fatigue last Saturday but the next day she states her symptoms were gone.  She was then tested on Thursday of this week and was positive.  Other family members have been tested and several of them were positive.  I informed her to stay away from anybody for the next 3 days even those family members that had tested negative.

## 2019-07-13 NOTE — Telephone Encounter (Signed)
Pt called to get results. Pt informed that her results are not back yet. Discussed how to access MyChart.

## 2019-07-15 ENCOUNTER — Other Ambulatory Visit: Payer: Self-pay

## 2019-07-15 ENCOUNTER — Encounter: Payer: Self-pay | Admitting: Family Medicine

## 2019-07-15 ENCOUNTER — Ambulatory Visit (INDEPENDENT_AMBULATORY_CARE_PROVIDER_SITE_OTHER): Payer: Medicare Other | Admitting: Family Medicine

## 2019-07-15 VITALS — Temp 97.0°F | Ht 60.0 in

## 2019-07-15 DIAGNOSIS — U071 COVID-19: Secondary | ICD-10-CM | POA: Diagnosis not present

## 2019-07-15 NOTE — Progress Notes (Signed)
   Subjective:   Documentation for virtual audio telecommunications through [place] encounter. She was unable to open video.   The patient was located at home. 2 patient identifiers used.  The provider was located in the office. The patient did consent to this visit and is aware of possible charges through their insurance for this visit.  The other persons participating in this telemedicine service were none.    Patient ID: Patricia Murphy, female    DOB: 1956-05-12, 63 y.o.   MRN: [id number]  HPI Chief Complaint  Patient presents with  . positive covid    no fever, no body aches, no symptoms, vomiting but has acid reflux    States she got tested for Covid-19 after being around her daughter who was positive. States her daughter was symptomatic. 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   States she tested positive for Covid-19 on 07/13/2019. States she was coughing more last week but now just has her usual allergy type symptoms. Feels "good". States her husband is coughing more.   States she has been isolated in her home since result came back.   Denies fever, chills, sore throat, body aches, chest pain, shortness of breath, abdominal pain, nausea, diarrhea.  No loss of smell or taste.   Reviewed allergies, medications, past medical, surgical, family, and social history.   Review of Systems Pertinent positives and negatives in the history of present illness.     Objective:   Physical Exam Temp (!) 97 F (36.1 C)   Ht 5' (1.524 m)   BMI 36.17 kg/m   Alert and oriented and in no acute distress. Speaking in complete sentences without difficulty. No coughing. Normal speech.       Assessment & Plan:  COVID-19 virus infection - Plan: MYCHART COVID-19 HOME MONITORING PROGRAM, Temperature monitoring Discussed potential worsening symptoms and supportive care. She appears to be doing well. She will continue to quarantine and follow up  virtually in 2 weeks. Consider retest.    Time spent on call was 14 minutes and in review of previous records 2 minutes total.  This virtual service is not related to other E/M service within previous 7 days.

## 2019-07-22 ENCOUNTER — Other Ambulatory Visit: Payer: Self-pay | Admitting: Internal Medicine

## 2019-07-22 DIAGNOSIS — Z1231 Encounter for screening mammogram for malignant neoplasm of breast: Secondary | ICD-10-CM

## 2019-07-29 ENCOUNTER — Ambulatory Visit: Payer: Medicare Other | Admitting: Family Medicine

## 2019-07-29 ENCOUNTER — Other Ambulatory Visit: Payer: Self-pay

## 2019-07-29 DIAGNOSIS — Z20822 Contact with and (suspected) exposure to covid-19: Secondary | ICD-10-CM

## 2019-07-31 ENCOUNTER — Other Ambulatory Visit: Payer: Self-pay

## 2019-07-31 DIAGNOSIS — Z20822 Contact with and (suspected) exposure to covid-19: Secondary | ICD-10-CM

## 2019-07-31 LAB — NOVEL CORONAVIRUS, NAA: SARS-CoV-2, NAA: NOT DETECTED

## 2019-08-02 LAB — NOVEL CORONAVIRUS, NAA: SARS-CoV-2, NAA: NOT DETECTED

## 2019-09-06 ENCOUNTER — Ambulatory Visit: Payer: Self-pay

## 2019-09-06 ENCOUNTER — Ambulatory Visit: Payer: 59

## 2020-02-01 ENCOUNTER — Ambulatory Visit: Payer: Medicare Other | Attending: Internal Medicine

## 2020-02-01 DIAGNOSIS — Z23 Encounter for immunization: Secondary | ICD-10-CM

## 2020-02-01 NOTE — Progress Notes (Signed)
   Covid-19 Vaccination Clinic  Name:  Patricia Murphy    MRN: [id number] DOB: [birth year]  02/01/2020  Patricia Murphy was observed post Covid-19 immunization for 15 minutes without incidence. She was provided with Vaccine Information Sheet and instruction to access the V-Safe system.   Patricia Murphy was instructed to call 911 with any severe reactions post vaccine: Marland Kitchen Difficulty breathing  . Swelling of your face and throat  . A fast heartbeat  . A bad rash all over your body  . Dizziness and weakness    Immunizations Administered    Name Date Dose VIS Date Route   Pfizer COVID-19 Vaccine 02/01/2020 12:14 PM 0.3 mL 11/22/2019 Intramuscular   Manufacturer: [employer], [employer]   Lot: [id number]   NDC: [address]-7

## 2020-02-03 ENCOUNTER — Ambulatory Visit: Payer: Medicare Other

## 2020-02-25 ENCOUNTER — Ambulatory Visit: Payer: Medicare Other | Attending: Internal Medicine

## 2020-02-25 DIAGNOSIS — Z23 Encounter for immunization: Secondary | ICD-10-CM

## 2020-02-25 NOTE — Progress Notes (Signed)
   Covid-19 Vaccination Clinic  Name:  Patricia Murphy    MRN: [id number] DOB: 02/02/1956  02/25/2020  [name] was observed post Covid-19 immunization for 15 minutes without incident. She was provided with Vaccine Information Sheet and instruction to access the V-Safe system.   [name] was instructed to call 911 with any severe reactions post vaccine: Marland Kitchen Difficulty breathing  . Swelling of face and throat  . A fast heartbeat  . A bad rash all over body  . Dizziness and weakness   Immunizations Administered    Name Date Dose VIS Date Route   Pfizer COVID-19 Vaccine 02/25/2020 11:35 AM 0.3 mL 11/22/2019 Intramuscular   Manufacturer: [employer], [employer]   Lot: [id number]   NDC: [address]-3

## 2020-09-16 ENCOUNTER — Other Ambulatory Visit: Payer: Self-pay | Admitting: Internal Medicine

## 2020-09-16 DIAGNOSIS — Z1231 Encounter for screening mammogram for malignant neoplasm of breast: Secondary | ICD-10-CM

## 2021-08-26 ENCOUNTER — Other Ambulatory Visit: Payer: Self-pay | Admitting: Internal Medicine

## 2021-08-26 DIAGNOSIS — Z1231 Encounter for screening mammogram for malignant neoplasm of breast: Secondary | ICD-10-CM

## 2021-10-14 ENCOUNTER — Ambulatory Visit: Payer: Medicare Other

## 2022-08-26 ENCOUNTER — Other Ambulatory Visit: Payer: Self-pay | Admitting: Obstetrics and Gynecology

## 2022-08-26 DIAGNOSIS — Z8249 Family history of ischemic heart disease and other diseases of the circulatory system: Secondary | ICD-10-CM

## 2022-08-31 ENCOUNTER — Other Ambulatory Visit (HOSPITAL_COMMUNITY): Payer: Self-pay | Admitting: Obstetrics and Gynecology

## 2022-08-31 DIAGNOSIS — R011 Cardiac murmur, unspecified: Secondary | ICD-10-CM

## 2022-09-01 ENCOUNTER — Ambulatory Visit (HOSPITAL_COMMUNITY)
Admission: RE | Admit: 2022-09-01 | Discharge: 2022-09-01 | Disposition: A | Discharge status: Home / Self Care | Payer: Medicare Other | Source: Ambulatory Visit | Attending: Obstetrics and Gynecology | Admitting: Obstetrics and Gynecology

## 2022-09-01 DIAGNOSIS — R011 Cardiac murmur, unspecified: Secondary | ICD-10-CM | POA: Diagnosis present

## 2022-09-04 LAB — ECHOCARDIOGRAM COMPLETE
AR max vel: 1.69 cm2
AV Area VTI: 1.75 cm2
AV Area mean vel: 1.47 cm2
AV Mean grad: 6.8 mmHg
AV Peak grad: 13.5 mmHg
Ao pk vel: 1.84 m/s
Area-P 1/2: 2.76 cm2
S' Lateral: 2.5 cm

## 2022-10-13 ENCOUNTER — Ambulatory Visit
Admission: RE | Admit: 2022-10-13 | Discharge: 2022-10-13 | Disposition: A | Discharge status: Home / Self Care | Payer: Medicare Other | Source: Ambulatory Visit | Attending: Obstetrics and Gynecology | Admitting: Obstetrics and Gynecology

## 2022-10-13 DIAGNOSIS — Z8249 Family history of ischemic heart disease and other diseases of the circulatory system: Secondary | ICD-10-CM

## 2022-10-26 ENCOUNTER — Encounter: Payer: Self-pay | Admitting: Internal Medicine

## 2022-11-07 NOTE — Progress Notes (Unsigned)
Cardiology Office Note:    Date:  11/07/2022   ID:  [name], DOB 12-09-1956, MRN [id number]  PCP:  Girtha Rm, NP-C   [facility] Providers Cardiologist:  None { Click to update primary MD,subspecialty MD or APP then REFRESH:1}    Referring MD: Arvella Nigh, MD   No chief complaint on file. ***  History of Present Illness:    Patricia Murphy is a 66 y.o. female with a hx of GERD, CAC 705, 97th percentile.  LDL ***  Cardiology Studies: TTE- EF 65-70 %, RV fxn is normal, no significant valve dx.  Past Medical History:  Diagnosis Date   Allergy    Chronic cough    Diabetes mellitus without complication ([place])    due to 6 months of steroids, was never high prior to steroid use for retinitis   GERD (gastroesophageal reflux disease)    Hypertension    Thoracic aortic atherosclerosis ([place])    on XR    Past Surgical History:  Procedure Laterality Date   ESOPHAGEAL MANOMETRY N/A 07/31/2017   Procedure: ESOPHAGEAL MANOMETRY (EM);  Surgeon: Mauri Pole, MD;  Location: [facility];  Service: Endoscopy;  Laterality: N/A;    Current Medications: No outpatient medications have been marked as taking for the 11/08/22 encounter (Appointment) with Janina Mayo, MD.   Current Facility-Administered Medications for the 11/08/22 encounter (Appointment) with Janina Mayo, MD  Medication   0.9 %  sodium chloride infusion     Allergies:   Ivp dye [iodinated contrast media], Penicillins, and Shellfish allergy   Social History   Socioeconomic History   Marital status: Married    Spouse name: Not on file   Number of children: 4   Years of education: Not on file   Highest education level: Not on file  Occupational History   Occupation: retired  Tobacco Use   Smoking status: Never   Smokeless tobacco: Never  Substance and Sexual Activity   Alcohol use: No   Drug use: No   Sexual activity: Yes    Birth control/protection: None  Other Topics  Concern   Not on file  Social History Narrative   Not on file   Social Determinants of Health   Financial Resource Strain: Not on file  Food Insecurity: Not on file  Transportation Needs: Not on file  Physical Activity: Not on file  Stress: Not on file  Social Connections: Not on file     Family History: The patient's ***family history includes Diabetes in her brother and brother; Heart attack in her sister and sister; Heart disease in her brother, father, maternal grandmother, and sister; Heart disease (age of onset: 40) in her mother; Stroke in her sister. There is no history of Colon cancer.  ROS:   Please see the history of present illness.    *** All other systems reviewed and are negative.  EKGs/Labs/Other Studies Reviewed:    The following studies were reviewed today: ***  EKG:  EKG is *** ordered today.  The ekg ordered today demonstrates ***  Recent Labs: No results found for requested labs within last 365 days.  Recent Lipid Panel    Component Value Date/Time   CHOL 209 (H) 05/17/2017 0926   TRIG 62 05/17/2017 0926   HDL 72 05/17/2017 0926   CHOLHDL 2.9 05/17/2017 0926   VLDL 12 05/17/2017 0926   LDLCALC 125 (H) 05/17/2017 0926     Risk Assessment/Calculations:   {Does this patient have  ATRIAL FIBRILLATION?:[phone]}  No BP recorded.  {Refresh Note OR Click here to enter BP  :1}***         Physical Exam:    VS:  There were no vitals taken for this visit.    Wt Readings from Last 3 Encounters:  11/14/18 185 lb 3.2 oz (84 kg)  03/07/18 187 lb (84.8 kg)  02/22/18 181 lb 6.4 oz (82.3 kg)     GEN: *** Well nourished, well developed in no acute distress HEENT: Normal NECK: No JVD; No carotid bruits LYMPHATICS: No lymphadenopathy CARDIAC: ***RRR, no murmurs, rubs, gallops RESPIRATORY:  Clear to auscultation without rales, wheezing or rhonchi  ABDOMEN: Soft, non-tender, non-distended MUSCULOSKELETAL:  No edema; No deformity  SKIN: Warm and  dry NEUROLOGIC:  Alert and oriented x 3 PSYCHIATRIC:  Normal affect   ASSESSMENT:    CAC: Elevated CAC: > 75th percentile.  His CAC score is elevated. Recommend statin therapy and asa 81 mg daily. LDL goal < 70 mg/dL. Recommend continued lifestyle modifications including healthy diet, lipid management, DM2 screening, and exercise.  PLAN:    In order of problems listed above:  ***      {Are you ordering a CV Procedure (e.g. stress test, cath, DCCV, TEE, etc)?   Press F2        :[id number]}    Medication Adjustments/Labs and Tests Ordered: Current medicines are reviewed at length with the patient today.  Concerns regarding medicines are outlined above.  No orders of the defined types were placed in this encounter.  No orders of the defined types were placed in this encounter.   There are no Patient Instructions on file for this visit.   Signed, Maisie Fus, MD  11/07/2022 3:35 PM    [facility]

## 2022-11-08 ENCOUNTER — Encounter: Payer: Self-pay | Admitting: Internal Medicine

## 2022-11-08 ENCOUNTER — Ambulatory Visit: Payer: Medicare Other | Attending: Internal Medicine | Admitting: Internal Medicine

## 2022-11-08 VITALS — BP 160/80 | HR 71 | Ht 60.0 in | Wt 172.4 lb

## 2022-11-08 DIAGNOSIS — I2584 Coronary atherosclerosis due to calcified coronary lesion: Secondary | ICD-10-CM | POA: Diagnosis not present

## 2022-11-08 DIAGNOSIS — I2510 Atherosclerotic heart disease of native coronary artery without angina pectoris: Secondary | ICD-10-CM | POA: Diagnosis not present

## 2022-11-08 MED ORDER — ATORVASTATIN CALCIUM 40 MG PO TABS: 40.0000 mg | ORAL_TABLET | Freq: Every day | ORAL | 3 refills | Status: DC

## 2022-11-08 MED ORDER — CHLORTHALIDONE 25 MG PO TABS: 25.0000 mg | ORAL_TABLET | Freq: Every day | ORAL | 1 refills | Status: DC

## 2022-11-08 MED ORDER — ASPIRIN 81 MG PO TBEC: 81.0000 mg | DELAYED_RELEASE_TABLET | Freq: Every day | ORAL | 3 refills | Status: AC

## 2022-11-08 MED ORDER — AMLODIPINE BESYLATE 10 MG PO TABS: 10.0000 mg | ORAL_TABLET | Freq: Every day | ORAL | 3 refills | Status: DC

## 2022-11-08 NOTE — Patient Instructions (Addendum)
Medication Instructions:   STOP HYDROCHLOROTHIAZIDE   START: CHLORTHALIDONE 25mg  ONCE DAILY   START: ATORVASTATIN 40mg  ONCE DAILY   START: ASPIRIN 81mg  ONCE DAILY   START: AMLODIPINE 10mg  ONCE DAILY   *If you need a refill on your cardiac medications before your next appointment, please call your pharmacy*  Lab Work:  Please return for Blood Work in 2 WEEKS. No appointment needed, lab here at the office is open Monday-Friday from 8AM to 4PM and closed daily for lunch from 12:45-1:45.   If you have labs (blood work) drawn today and your tests are completely normal, you will receive your results only by: MyChart Message (if you have MyChart) OR A paper copy in the mail If you have any lab test that is abnormal or we need to change your treatment, we will call you to review the results.  Testing/Procedures: None Ordered At This Time.   Follow-Up: At [facility], you and your health needs are our priority.  As part of our continuing mission to provide you with exceptional heart care, we have created designated Provider Care Teams.  These Care Teams include your primary Cardiologist (physician) and Advanced Practice Providers (APPs -  Physician Assistants and Nurse Practitioners) who all work together to provide you with the care you need, when you need it.  PLEASE SCHEDULE APPOINTMENT WITH OUR PHARMACIST IN 3 MONTHS FOR BLOOD PRESSURE MANAGEMENT   THEN  Your next appointment:   6 month(s)  The format for your next appointment:   In Person  Provider:   , MD

## 2022-11-17 ENCOUNTER — Telehealth: Payer: Self-pay | Admitting: Internal Medicine

## 2022-11-17 NOTE — Telephone Encounter (Signed)
  Pt c/o BP issue: STAT if pt c/o blurred vision, one-sided weakness or slurred speech  1. What are your last 5 BP readings?142/64 last night   124/53 - today 119/53 - recent  2. Are you having any other symptoms (ex. Dizziness, headache, blurred vision, passed out)? None   3. What is your BP issue?  119/53  Pt said, she is concern that her BP low. She said her recent reading is 119/53 after taking all her meds this morning around 8 am. She wanted to know what she needs to do. She denied any symptoms

## 2022-11-17 NOTE — Telephone Encounter (Signed)
She saw Dr. Wyline Mood on 11/28 - she started chlorthalidone 25mg  QD, amlodipine 10mg  QD at last visit.   She is worried her BP is too low. Advised BP readings are OK. 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 She is concerned her meds could be affecting this. Advised not likely but would send to MD.   Reviewed with her last MD note:   HTN: Elevated BP. Will stop hydrochlorothiazide 25 mg daily, it makes her not feel well. Will start chlorthalidone 25 mg daily , and increase norvasc 5 mg to 10 mg daily, will continue losartan 100 mg daily. BP goal < 130/90 mmHg   Informed her that a decrease in BP is an expected response to med changes and likely has taken full effect since it has been 10-14 days since changes were made.

## 2022-11-23 LAB — BASIC METABOLIC PANEL
BUN/Creatinine Ratio: 23 (ref 12–28)
BUN: 24 mg/dL (ref 8–27)
CO2: 24 mmol/L (ref 20–29)
Calcium: 9.7 mg/dL (ref 8.7–10.3)
Chloride: 103 mmol/L (ref 96–106)
Creatinine, Ser: 1.05 mg/dL — ABNORMAL HIGH (ref 0.57–1.00)
Glucose: 104 mg/dL — ABNORMAL HIGH (ref 70–99)
Potassium: 4.3 mmol/L (ref 3.5–5.2)
Sodium: 140 mmol/L (ref 134–144)
eGFR: 58 mL/min/{1.73_m2} — ABNORMAL LOW (ref >59)

## 2022-11-24 ENCOUNTER — Telehealth: Payer: Self-pay | Admitting: Internal Medicine

## 2022-11-24 ENCOUNTER — Other Ambulatory Visit: Payer: Medicare Other

## 2022-11-24 NOTE — Telephone Encounter (Signed)
Maisie Fus, MD  Lindell Spar, RN Cc: Baird Cancer, RN Caller: Unspecified (Today,  8:58 AM) No significant changes from prior labs. Very minor bump in kidney function, can be managed with hydration, aim for up to 6 to 8 cups per day. No changes in medications recommended.        Patient called w/results

## 2022-11-24 NOTE — Telephone Encounter (Signed)
Patient would like a call back to discuss test results.

## 2022-11-24 NOTE — Telephone Encounter (Signed)
Spoke with patient - she is calling about lab results - BMET done 12/12  Advised will send to MD to provide result note and then we will follow up

## 2023-02-08 ENCOUNTER — Ambulatory Visit: Payer: Medicare Other | Attending: Cardiovascular Disease | Admitting: Student

## 2023-02-08 ENCOUNTER — Encounter: Payer: Self-pay | Admitting: Student

## 2023-02-08 VITALS — BP 134/67 | HR 68

## 2023-02-08 DIAGNOSIS — I10 Essential (primary) hypertension: Secondary | ICD-10-CM

## 2023-02-08 NOTE — Assessment & Plan Note (Signed)
Assessment: BP is slightly above the goal in office  134/67 with heart rate 68  Home BP on non validated arm cuff ~130/70  Takes and tolerates current BP medications well without any side effects  Denies SOB, palpitation, chest pain, headaches,or swelling Could improve on salt intake and start doing regular exercise    Plan:  No medication changes  Continue taking amlodipine 10 mg daily, losartan 100 mg daily, chlorthalidone 25 mg daily  Patient to bring home BP monitor  for validation at the next ov Patient to keep record of BP readings with heart rate and report to [place] at the next ov Patient to cut down on salt intake ( 1st week stop eating salty snacks, 2nd week onward cut down salt amount in half in all food recipes and reduce eating out from 2-3 X week to 1 X week) Patient to start 15 min walks with 10 min of chair exercise and work her way up ( goal is 30 min walks with 20 min chair yoga every other day) Patient to see PharmD in 4 weeks for follow up  Follow up lab(s): none

## 2023-02-08 NOTE — Progress Notes (Signed)
Patient ID: Patricia Murphy                 DOB: Feb 05, 1956                      MRN: [id number]      HPI: Patricia Murphy is a 67 y.o. female referred by Dr. Phineas Inches to HTN clinic. PMH is significant for hypertension, aortic atherosclerosis, GERD    At the last visit with Dr. Harl Bowie on Nov 28,2023 HCTZ was changed to chlorthalidone and amlodipine dose was increased to 10 mg daily.   Patient presented today for BP follow up. Patient brought in her medications but forgot to bring in BP log and BP monitor. Patient reports she takes her medications daily without forgetting. Reports home BP on her new arm cuff ~ 130/70 range and she does not recalls the heart rate. She has started cooking home and avoid eating out but some days it gets busy with grandchildren and she ends up eating out. Uses regular amount of salt while cooking and snack on salty/sweet options (chips, cookie) drink 2 regular sodas per day. Does not do any exercise but motivated to start. She tolerates the new medications well.    Current HTN meds: amlodipine 10 mg daily, chlorthalidone 25 mg daily, losartan 100 mg daily  Previously tried: HCTZ- did not feel well BP goal: <130/80  Family History: mother side GM and mother, father - died from MI Sister- open heart surgery  Other sister - MI   Social History:  Alcohol: none smoking: never Recreational drug use: never  Diet: pinto beans, string beans, collard green, rice,  snacks- potato chips, cookie  Sodas- 2 per day  Cook at home with regular amount of salt -  not reduced salt intake Eats out 2-3 times per week   Exercise: none    Home BP readings:  ~130/70 Wt Readings from Last 3 Encounters:  11/08/22 172 lb 6.4 oz (78.2 kg)  11/14/18 185 lb 3.2 oz (84 kg)  03/07/18 187 lb (84.8 kg)   BP Readings from Last 3 Encounters:  02/08/23 134/67  11/08/22 (!) 160/80  11/14/18 140/70   Pulse Readings from Last 3 Encounters:  02/08/23 68  11/08/22 71  11/14/18 75     Renal function: CrCl cannot be calculated (Patient's most recent lab result is older than the maximum 21 days allowed.).  Past Medical History:  Diagnosis Date   Allergy    Chronic cough    Diabetes mellitus without complication ([place])    due to 6 months of steroids, was never high prior to steroid use for retinitis   GERD (gastroesophageal reflux disease)    Hypertension    Thoracic aortic atherosclerosis ([place])    on XR    Current Outpatient Medications on File Prior to Visit  Medication Sig Dispense Refill   amLODipine (NORVASC) 10 MG tablet Take 1 tablet (10 mg total) by mouth daily. 90 tablet 3   aspirin EC 81 MG tablet Take 1 tablet (81 mg total) by mouth daily. Swallow whole. 90 tablet 3   atorvastatin (LIPITOR) 40 MG tablet Take 1 tablet (40 mg total) by mouth daily. 90 tablet 3   chlorthalidone (HYGROTON) 25 MG tablet Take 1 tablet (25 mg total) by mouth daily. 90 tablet 1   famotidine (PEPCID) 40 MG tablet TAKE 1 TABLET(40 MG) BY MOUTH AT BEDTIME 30 tablet 2   fluticasone (FLONASE) 50 MCG/ACT nasal spray SHAKE LIQUID  AND USE 2 SPRAYS IN EACH NOSTRIL DAILY 16 g 0   levocetirizine (XYZAL) 5 MG tablet Take 1 tablet (5 mg total) by mouth every evening. 30 tablet 2   losartan (COZAAR) 100 MG tablet Take 100 mg by mouth daily.     nitrofurantoin, macrocrystal-monohydrate, (MACROBID) 100 MG capsule Take 1 capsule (100 mg total) by mouth 2 (two) times daily. 10 capsule 0   triamcinolone cream (KENALOG) 0.1 % Apply 1 application topically 2 (two) times daily. 30 g 0   Current Facility-Administered Medications on File Prior to Visit  Medication Dose Route Frequency Provider Last Rate Last Admin   0.9 %  sodium chloride infusion  500 mL Intravenous Continuous Nandigam, Kavitha V, MD        Allergies  Allergen Reactions   Ivp Dye [Iodinated Contrast Media] Nausea And Vomiting   Penicillins    Shellfish Allergy     Blood pressure 134/67, pulse 68, SpO2 99  %.    Hypertension Assessment: BP is slightly above the goal in office  134/67 with heart rate 68  Home BP on non validated arm cuff ~130/70  Takes and tolerates current BP medications well without any side effects  Denies SOB, palpitation, chest pain, headaches,or swelling Could improve on salt intake and start doing regular exercise    Plan:  No medication changes  Continue taking amlodipine 10 mg daily, losartan 100 mg daily, chlorthalidone 25 mg daily  Patient to bring home BP monitor  for validation at the next ov Patient to keep record of BP readings with heart rate and report to [place] at the next ov Patient to cut down on salt intake ( 1st week stop eating salty snacks, 2nd week onward cut down salt amount in half in all food recipes and reduce eating out from 2-3 X week to 1 X week) Patient to start 15 min walks with 10 min of chair exercise and work her way up ( goal is 30 min walks with 20 min chair yoga every other day) Patient to see PharmD in 4 weeks for follow up  Follow up lab(s): none       Thank you  Cammy Copa, Pharm.D [facility] A Division of [facility] [address] [place], [place] [address]  Phone: [phone]; Fax: [phone]

## 2023-02-08 NOTE — Patient Instructions (Signed)
No changes made by your pharmacist Cammy Copa, PharmD at today's visit:     Bring all of your meds, your BP cuff and your record of home blood pressures to your next appointment.    HOW TO TAKE YOUR BLOOD PRESSURE AT HOME  Rest 5 minutes before taking your blood pressure.  Don't smoke or drink caffeinated beverages for at least 30 minutes before. Take your blood pressure before (not after) you eat. Sit comfortably with your back supported and both feet on the floor (don't cross your legs). Elevate your arm to heart level on a table or a desk. Use the proper sized cuff. It should fit smoothly and snugly around your bare upper arm. There should be enough room to slip a fingertip under the cuff. The bottom edge of the cuff should be 1 inch above the crease of the elbow. Ideally, take 3 measurements at one sitting and record the average.  Important lifestyle changes to control high blood pressure  Intervention  Effect on the BP  Lose extra pounds and watch your waistline Weight loss is one of the most effective lifestyle changes for controlling blood pressure. If you're overweight or obese, losing even a small amount of weight can help reduce blood pressure. Blood pressure might go down by about 1 millimeter of mercury (mm Hg) with each kilogram (about 2.2 pounds) of weight lost.  Exercise regularly As a general goal, aim for at least 30 minutes of moderate physical activity every day. Regular physical activity can lower high blood pressure by about 5 to 8 mm Hg.  Eat a healthy diet Eating a diet rich in whole grains, fruits, vegetables, and low-fat dairy products and low in saturated fat and cholesterol. A healthy diet can lower high blood pressure by up to 11 mm Hg.  Reduce salt (sodium) in your diet Even a small reduction of sodium in the diet can improve heart health and reduce high blood pressure by about 5 to 6 mm Hg.  Limit alcohol One drink equals 12 ounces of beer, 5 ounces of  wine, or 1.5 ounces of 80-proof liquor.  Limiting alcohol to less than one drink a day for women or two drinks a day for men can help lower blood pressure by about 4 mm Hg.   If you have any questions or concerns please use My Chart to send questions or call the office at [phone]

## 2023-03-14 ENCOUNTER — Encounter: Payer: Self-pay | Admitting: Student

## 2023-03-14 ENCOUNTER — Ambulatory Visit: Payer: Medicare Other | Attending: Cardiology | Admitting: Student

## 2023-03-14 VITALS — BP 109/64 | HR 67

## 2023-03-14 DIAGNOSIS — I10 Essential (primary) hypertension: Secondary | ICD-10-CM

## 2023-03-14 DIAGNOSIS — D509 Iron deficiency anemia, unspecified: Secondary | ICD-10-CM | POA: Insufficient documentation

## 2023-03-14 NOTE — Progress Notes (Signed)
Patient ID: Patricia Murphy                 DOB: Feb 23, 1956                      MRN: [id number]      HPI: Patricia Murphy is a 67 y.o. female referred by Dr. Phineas Inches to HTN clinic. PMH is significant for hypertension, aortic atherosclerosis, GERD    At the last visit with Dr. Harl Bowie on Nov 28,2023 HCTZ was changed to chlorthalidone and amlodipine dose was increased to 10 mg daily.  Patient presented today for BP follow up and home cuff validation. Home cuff found out to be accurate. Home BP ~132/63 heart rate 65 the highest home BP was 166/66 with HR 64, reports she was stressed that day. She has improved her diet a lot,does not eat  salty snacks and sweets . Her next gaol is to cut down on soda. Reports chlorthalidone affects her sleep at night due to frequent bathroom visits even when she takes it in the morning. She does not cut down on caffine or fluid intake until 10:00 and her usual bedtime is 10:30. Suggest to cut down on fluid intake after 5:00.Takes current BP medications regularly and tolerates them well.        1st reading on home cuff  124/56 66  1st reading on office cuff  109/64 67  2nd reading on home cuff  125/53 67  2nd reading on office cuff  108/64         Current HTN meds: amlodipine 10 mg daily, chlorthalidone 25 mg daily, losartan 100 mg daily  Previously tried: HCTZ- did not feel well BP goal: <130/80  Family History: mother side GM and mother, father - died from MI Sister- open heart surgery  Other sister - MI   Social History:  Alcohol: none smoking: never Recreational drug use: never  Diet: pinto beans, string beans, collard green, rice,  snacks- potato chips, cookie  Sodas- 2 per day  Cook at home with regular amount of salt -  not reduced salt intake Eats out 2-3 times per week  Drinks: sodas, coffee, water   Exercise: walking 15 min twice daily most days of the week    Wt Readings from Last 3 Encounters:  11/08/22 172 lb 6.4 oz (78.2 kg)   11/14/18 185 lb 3.2 oz (84 kg)  03/07/18 187 lb (84.8 kg)   BP Readings from Last 3 Encounters:  03/14/23 109/64  02/08/23 134/67  11/08/22 (!) 160/80   Pulse Readings from Last 3 Encounters:  03/14/23 67  02/08/23 68  11/08/22 71    Renal function: CrCl cannot be calculated (Patient's most recent lab result is older than the maximum 21 days allowed.).  Past Medical History:  Diagnosis Date   Allergy    Chronic cough    Diabetes mellitus without complication ([place])    due to 6 months of steroids, was never high prior to steroid use for retinitis   GERD (gastroesophageal reflux disease)    Hypertension    Thoracic aortic atherosclerosis ([place])    on XR    Current Outpatient Medications on File Prior to Visit  Medication Sig Dispense Refill   omeprazole (PRILOSEC) 20 MG capsule Take 20 mg by mouth daily.     amLODipine (NORVASC) 10 MG tablet Take 1 tablet (10 mg total) by mouth daily. 90 tablet 3   aspirin EC 81 MG tablet Take  1 tablet (81 mg total) by mouth daily. Swallow whole. 90 tablet 3   atorvastatin (LIPITOR) 40 MG tablet Take 1 tablet (40 mg total) by mouth daily. 90 tablet 3   chlorthalidone (HYGROTON) 25 MG tablet Take 1 tablet (25 mg total) by mouth daily. 90 tablet 1   famotidine (PEPCID) 40 MG tablet TAKE 1 TABLET(40 MG) BY MOUTH AT BEDTIME 30 tablet 2   fluticasone (FLONASE) 50 MCG/ACT nasal spray SHAKE LIQUID AND USE 2 SPRAYS IN EACH NOSTRIL DAILY 16 g 0   levocetirizine (XYZAL) 5 MG tablet Take 1 tablet (5 mg total) by mouth every evening. 30 tablet 2   losartan (COZAAR) 100 MG tablet Take 100 mg by mouth daily.     nitrofurantoin, macrocrystal-monohydrate, (MACROBID) 100 MG capsule Take 1 capsule (100 mg total) by mouth 2 (two) times daily. 10 capsule 0   triamcinolone cream (KENALOG) 0.1 % Apply 1 application topically 2 (two) times daily. 30 g 0   Current Facility-Administered Medications on File Prior to Visit  Medication Dose Route Frequency Provider  Last Rate Last Admin   0.9 %  sodium chloride infusion  500 mL Intravenous Continuous Nandigam, Kavitha V, MD        Allergies  Allergen Reactions   Ivp Dye [Iodinated Contrast Media] Nausea And Vomiting   Penicillins    Shellfish Allergy     Blood pressure 109/64, pulse 67, SpO2 100 %.    Hypertension Assessment: BP controlled 109/64 heart rate 67  Takes and tolerates current BP medications well without any side effects Home BP validated - found out to be within 10 points compared to office cuff  Denies SOB, palpitation, chest pain, headaches,or swelling Could improve on diet  Doing regular exercise    Plan:  No medication changes  Continue taking amlodipine 10 mg daily, losartan 100 mg daily, chlorthalidone 25 mg daily  [name]  Follow up appointment with Dr.Branch is on 05/15/2023      Thank you  Cammy Copa, Pharm.D [facility] A Division of [facility] [address] [place], [place] [address]  Phone: [phone]; Fax: [phone]

## 2023-03-14 NOTE — Assessment & Plan Note (Signed)
Assessment: BP controlled 109/64 heart rate 67  Takes and tolerates current BP medications well without any side effects Home BP validated - found out to be within 10 points compared to office cuff  Denies SOB, palpitation, chest pain, headaches,or swelling Could improve on diet  Doing regular exercise    Plan:  No medication changes  Continue taking amlodipine 10 mg daily, losartan 100 mg daily, chlorthalidone 25 mg daily  [name]  Follow up appointment with Dr.Branch is on 05/15/2023

## 2023-03-14 NOTE — Patient Instructions (Addendum)
No changes made by your pharmacist Brixton Franko, PharmD at today's visit:       HOW TO TAKE YOUR BLOOD PRESSURE AT HOME  Rest 5 minutes before taking your blood pressure.  Don't smoke or drink caffeinated beverages for at least 30 minutes before. Take your blood pressure before (not after) you eat. Sit comfortably with your back supported and both feet on the floor (don't cross your legs). Elevate your arm to heart level on a table or a desk. Use the proper sized cuff. It should fit smoothly and snugly around your bare upper arm. There should be enough room to slip a fingertip under the cuff. The bottom edge of the cuff should be 1 inch above the crease of the elbow. Ideally, take 3 measurements at one sitting and record the average.  Important lifestyle changes to control high blood pressure  Intervention  Effect on the BP  Lose extra pounds and watch your waistline Weight loss is one of the most effective lifestyle changes for controlling blood pressure. If you're overweight or obese, losing even a small amount of weight can help reduce blood pressure. Blood pressure might go down by about 1 millimeter of mercury (mm Hg) with each kilogram (about 2.2 pounds) of weight lost.  Exercise regularly As a general goal, aim for at least 30 minutes of moderate physical activity every day. Regular physical activity can lower high blood pressure by about 5 to 8 mm Hg.  Eat a healthy diet Eating a diet rich in whole grains, fruits, vegetables, and low-fat dairy products and low in saturated fat and cholesterol. A healthy diet can lower high blood pressure by up to 11 mm Hg.  Reduce salt (sodium) in your diet Even a small reduction of sodium in the diet can improve heart health and reduce high blood pressure by about 5 to 6 mm Hg.  Limit alcohol One drink equals 12 ounces of beer, 5 ounces of wine, or 1.5 ounces of 80-proof liquor.  Limiting alcohol to less than one drink a day for women or two  drinks a day for men can help lower blood pressure by about 4 mm Hg.   If you have any questions or concerns please use My Chart to send questions or call the office at [phone]  

## 2023-05-07 ENCOUNTER — Other Ambulatory Visit: Payer: Self-pay | Admitting: Internal Medicine

## 2023-05-09 ENCOUNTER — Other Ambulatory Visit: Payer: Self-pay | Admitting: Internal Medicine

## 2023-05-15 ENCOUNTER — Encounter: Payer: Self-pay | Admitting: Internal Medicine

## 2023-05-15 ENCOUNTER — Ambulatory Visit: Payer: Medicare Other | Attending: Internal Medicine | Admitting: Internal Medicine

## 2023-05-15 ENCOUNTER — Telehealth: Payer: Self-pay

## 2023-05-15 VITALS — BP 120/60 | HR 79 | Ht 60.0 in | Wt 180.6 lb

## 2023-05-15 DIAGNOSIS — I2584 Coronary atherosclerosis due to calcified coronary lesion: Secondary | ICD-10-CM

## 2023-05-15 DIAGNOSIS — I10 Essential (primary) hypertension: Secondary | ICD-10-CM | POA: Diagnosis not present

## 2023-05-15 DIAGNOSIS — I2510 Atherosclerotic heart disease of native coronary artery without angina pectoris: Secondary | ICD-10-CM

## 2023-05-15 NOTE — Telephone Encounter (Signed)
Lab order needed to be re-ordered.  Completed and given to lab tech

## 2023-05-15 NOTE — Patient Instructions (Signed)
Medication Instructions:  DISCONTINUE CHLORTHALIDONE 25MG  *If you need a refill on your cardiac medications before your next appointment, please call your pharmacy*  Lab Work: BLOOD WORK TODAY If you have labs (blood work) drawn today and your tests are completely normal, you will receive your results only by: MyChart Message (if you have MyChart) OR A paper copy in the mail If you have any lab test that is abnormal or we need to change your treatment, we will call you to review the results.  Follow-Up: At [facility], you and your health needs are our priority.  As part of our continuing mission to provide you with exceptional heart care, we have created designated Provider Care Teams.  These Care Teams include your primary Cardiologist (physician) and Advanced Practice Providers (APPs -  Physician Assistants and Nurse Practitioners) who all work together to provide you with the care you need, when you need it.     Your next appointment:   1 year(s)  Provider:   Maisie Fus, MD

## 2023-05-15 NOTE — Progress Notes (Signed)
Cardiology Office Note:    Date:  05/15/2023   ID:  Patricia Murphy, DOB [birth year], MRN [id number]  PCP:  Avanell Shackleton, NP-C   [facility] Providers Cardiologist:  Maisie Fus, MD     Referring MD: Avanell Shackleton, NP-C   No chief complaint on file. Elevated CAC  History of Present Illness:    Patricia Murphy is a 67 y.o. female with a hx of GERD, DM2, HTN, CAC 705, 97th percentile. Mother had an MI, deceased. Father died of an MI. Sister had a CABG. Sister with MI. No smoking. No cardiac stress tests in the past. She runs around with little kids, and she denies SOB or chest pain. She can walk up stairs without issues. No DM2.   08/01/2022 TC 219 HDL 73 LDL 134  Cardiology Studies: TTE- EF 65-70 %, RV fxn is normal, no significant valve dx.  Interim Hx 05/15/2023 She is doing well, planning to go to [place] soon. Blood pressure is well controlled today.   Past Medical History:  Diagnosis Date   Allergy    Chronic cough    Diabetes mellitus without complication (HCC)    due to 6 months of steroids, was never high prior to steroid use for retinitis   GERD (gastroesophageal reflux disease)    Hypertension    Thoracic aortic atherosclerosis (HCC)    on XR    Past Surgical History:  Procedure Laterality Date   ESOPHAGEAL MANOMETRY N/A 07/31/2017   Procedure: ESOPHAGEAL MANOMETRY (EM);  Surgeon: Napoleon Form, MD;  Location: [facility];  Service: Endoscopy;  Laterality: N/A;    Current Medications: No outpatient medications have been marked as taking for the 05/15/23 encounter (Appointment) with Maisie Fus, MD.   Current Facility-Administered Medications for the 05/15/23 encounter (Appointment) with Maisie Fus, MD  Medication   0.9 %  sodium chloride infusion     Allergies:   Ivp dye [iodinated contrast media], Penicillins, and Shellfish allergy   Social History   Socioeconomic History   Marital status: Married    Spouse name: Not on  file   Number of children: 4   Years of education: Not on file   Highest education level: Not on file  Occupational History   Occupation: retired  Tobacco Use   Smoking status: Never   Smokeless tobacco: Never  Substance and Sexual Activity   Alcohol use: No   Drug use: No   Sexual activity: Yes    Birth control/protection: None  Other Topics Concern   Not on file  Social History Narrative   Not on file   Social Determinants of Health   Financial Resource Strain: Not on file  Food Insecurity: Not on file  Transportation Needs: Not on file  Physical Activity: Not on file  Stress: Not on file  Social Connections: Not on file     Family History: The patient's family history includes Diabetes in her brother and brother; Heart attack in her sister and sister; Heart disease in her brother, father, maternal grandmother, and sister; Heart disease (age of onset: 66) in her mother; Stroke in her sister. There is no history of Colon cancer.  ROS:   Please see the history of present illness.     All other systems reviewed and are negative.  EKGs/Labs/Other Studies Reviewed:    The following studies were reviewed today:   EKG:  EKG is  ordered today.  The ekg ordered today demonstrates  11/08/2022- NSR, minimal LVH  Recent Labs: 11/22/2022: BUN 24; Creatinine, Ser 1.05; Potassium 4.3; Sodium 140   Recent Lipid Panel    Component Value Date/Time   CHOL 209 (H) 05/17/2017 0926   TRIG 62 05/17/2017 0926   HDL 72 05/17/2017 0926   CHOLHDL 2.9 05/17/2017 0926   VLDL 12 05/17/2017 0926   LDLCALC 125 (H) 05/17/2017 0926     Risk Assessment/Calculations:     Physical Exam:    VS:   Vitals:   05/15/23 0807  BP: 120/60  Pulse: 79  SpO2: 99%     Wt Readings from Last 3 Encounters:  11/08/22 172 lb 6.4 oz (78.2 kg)  11/14/18 185 lb 3.2 oz (84 kg)  03/07/18 187 lb (84.8 kg)     GEN:  Well nourished, well developed in no acute distress HEENT: Normal NECK: No  JVD; No carotid bruits LYMPHATICS: No lymphadenopathy CARDIAC: RRR, SEM, rubs, gallops RESPIRATORY:  Clear to auscultation without rales, wheezing or rhonchi  ABDOMEN: Soft, non-tender, non-distended MUSCULOSKELETAL:  No edema; No deformity  SKIN: Warm and dry NEUROLOGIC:  Alert and oriented x 3 PSYCHIATRIC:  Normal affect   ASSESSMENT:    CAC: Elevated CAC: > 75th percentile.  Her CAC score is elevated. Recommend statin therapy and asa 81 mg daily. LDL goal < 70 mg/dL. Recommend continued lifestyle modifications including healthy diet, lipid management, DM2 screening, and exercise. She is asymptomatic, no plans for an ischemic evaluation at this time. We discussed that if she has new onset of CP or SOB, to let us know. If significant, we discussed for her to call EMS   Murmur: RUSB SEM, asymptomatic likely AS although no significant gradient on her echo  HTN: Elevated BP. Will stop hydrochlorothiazide  25 mg daily, it makes her not feel well. Continue norvasc 10 mg daily, will continue losartan 100 mg daily. Was not taking chlorthalidone, BP well controlled today, can DC. BP goal < 130/90 mmHg. She is at goal  HLD: continue atorvastatin 40 mg daily  Will obtain fasting lipids in FU  PLAN:    In order of problems listed above:  Fasting lipids Follow up 1 year      Medication Adjustments/Labs and Tests Ordered: Current medicines are reviewed at length with the patient today.  Concerns regarding medicines are outlined above.  No orders of the defined types were placed in this encounter.  No orders of the defined types were placed in this encounter.   There are no Patient Instructions on file for this visit.   Signed, Maisie Fus, MD  05/15/2023 8:04 AM    [facility]

## 2023-05-16 LAB — LIPID PANEL
Chol/HDL Ratio: 2.3 ratio (ref 0.0–4.4)
Cholesterol, Total: 172 mg/dL (ref 100–199)
HDL: 74 mg/dL (ref >39)
LDL Chol Calc (NIH): 84 mg/dL (ref 0–99)
Triglycerides: 73 mg/dL (ref 0–149)
VLDL Cholesterol Cal: 14 mg/dL (ref 5–40)

## 2023-05-17 ENCOUNTER — Telehealth: Payer: Self-pay | Admitting: Internal Medicine

## 2023-05-17 NOTE — Telephone Encounter (Signed)
Spoke to the patient, for the past three month she has experienced muscle pain which lead to muscle cramps.. Usually pt use Hylands natural leg cramps to help relieve her symptoms. She associated her symptoms since taking atorvastatin. Last night, she experienced bad leg  cramps she took two tylenol, two Hylands naturals leg cramps and two extra strength tylenol this did not help her symptoms. As of today, her left leg hurts more than the right leg. She did not take any medication for muscle pain. Pt stated [place] and will like advise how to relieve her symptoms.  Will forward to MD and nurse for advise.

## 2023-05-17 NOTE — Telephone Encounter (Signed)
Spoke to the patient, explained Dr. Wyline Mood recommendation:  Please have her notify her PCP   Patient voiced understanding.

## 2023-05-17 NOTE — Telephone Encounter (Signed)
Pt c/o medication issue:  1. Name of Medication:  atorvastatin (LIPITOR) 40 MG tablet  2. How are you currently taking this medication (dosage and times per day)?  As prescribed  3. Are you having a reaction (difficulty breathing--STAT)?   4. What is your medication issue?   Patient states she is experiencing severe pain on and off in her left leg. She states the pain was more intense than ever last night and assumes it is due to Atorvastatin.

## 2023-09-08 ENCOUNTER — Other Ambulatory Visit: Payer: Self-pay | Admitting: Internal Medicine

## 2023-12-04 ENCOUNTER — Other Ambulatory Visit: Payer: Self-pay | Admitting: Internal Medicine

## 2024-03-05 ENCOUNTER — Ambulatory Visit: Admitting: Podiatry

## 2024-03-05 ENCOUNTER — Encounter: Payer: Self-pay | Admitting: Podiatry

## 2024-03-05 ENCOUNTER — Ambulatory Visit (INDEPENDENT_AMBULATORY_CARE_PROVIDER_SITE_OTHER)

## 2024-03-05 VITALS — Ht 60.0 in | Wt 180.6 lb

## 2024-03-05 DIAGNOSIS — M778 Other enthesopathies, not elsewhere classified: Secondary | ICD-10-CM

## 2024-03-05 DIAGNOSIS — M10472 Other secondary gout, left ankle and foot: Secondary | ICD-10-CM

## 2024-03-05 DIAGNOSIS — M7752 Other enthesopathy of left foot: Secondary | ICD-10-CM | POA: Diagnosis not present

## 2024-03-05 NOTE — Progress Notes (Signed)
  Subjective:  Patient ID: Patricia Murphy, female    DOB: 1956/03/17,  MRN: [id number]  Chief Complaint  Patient presents with   Gout    Pt is here due to left foot pain and swelling, pt states she was diagnose with gout over a month ago, and since then been having swelling to the foot.    Discussed the use of AI scribe software for clinical note transcription with the patient, who gave verbal consent to proceed.  History of Present Illness Patricia Murphy is a 68 year old female with gout who presents with persistent foot swelling and pain.  She was initially diagnosed with gout at an urgent care facility and was prescribed a short course of medication consisting of four pills, which she cannot recall. The medication caused nausea, leading her to consult her primary care doctor, who confirmed the diagnosis of gout.  Since the initial gout flare, she has experienced intermittent pain and tenderness in the affected foot, although it is not currently painful. The pain is described as 'off and on,' with the area being 'real tender.' Swelling has persisted for about a month, worsening when sitting without elevating the foot. She also notes discoloration in the affected area since the gout flare, which has not resolved, causing concern about its duration.  She does not recall the specific medications prescribed by her primary care doctor following the urgent care visit, but notes that her primary care doctor discarded the initial medication. She has not been on prednisone before and is concerned about potential side effects due to her sensitivity to medications.  She is allergic to shellfish but occasionally consumes oysters. No history of diabetes.      Objective:    Physical Exam VASCULAR: DP and PT pulse palpable. Foot is warm and well-perfused. Capillary fill time is brisk. DERMATOLOGIC: Normal skin turgor, texture, and temperature. No open lesions, rashes, or ulcerations. NEUROLOGIC:  Normal sensation to light touch and pressure. No paresthesias. ORTHOPEDIC: No ecchymosis, bruising, or gross deformity.  1+ edema in the ankle and mild tenderness on the anterior joint line laterally.   No images are attached to the encounter.    Results RADIOLOGY Foot x-ray: Mild anterior ankle fusion noted, otherwise unremarkable. Joint surfaces appear well-preserved. (03/05/2024)   Assessment:   1. Other secondary acute gout of left foot      Plan:  Patient was evaluated and treated and all questions answered.  Assessment and Plan Assessment & Plan Gout Recent gout diagnosis with persistent foot swelling and intermittent pain. Initial medication from urgent care caused adverse effects (likely colchicine, advised not to take narcotics for this by her PCP). Primary care confirmed gout diagnosis. Swelling and discoloration likely due to residual inflammation from gout flare. No new flare suspected. Educated on gout, its causes, dietary influences, potential recurrence, and importance of monitoring kidney function. Explained that swelling and discoloration are due to inflammation, which will take time to resolve. Discussed prednisone as a treatment option to reduce inflammation, with instructions to take it in the morning to avoid insomnia. - Prescribe prednisone taper for one week to reduce inflammation - Provide educational materials on gout and dietary recommendations - Advise elevation of foot to reduce swelling - Instruct to monitor for improvement and contact if symptoms persist beyond one to two months      No follow-ups on file.

## 2024-03-05 NOTE — Patient Instructions (Signed)
 VISIT SUMMARY:  Today, we discussed your recent gout diagnosis and the persistent foot swelling and pain you have been experiencing. We reviewed your symptoms, the medications you have taken, and your concerns about side effects.  YOUR PLAN:  -GOUT: Gout is a type of arthritis caused by the buildup of uric acid crystals in the joints, leading to pain and swelling. Your foot swelling and discoloration are likely due to residual inflammation from your recent gout flare. We will start you on a prednisone taper for one week to help reduce the inflammation. Please take the prednisone in the morning to avoid insomnia. Additionally, I have provided educational materials on gout and dietary recommendations to help manage your condition. Elevating your foot will also help reduce the swelling. Monitor your symptoms and contact us if they persist beyond one to two months.  INSTRUCTIONS:  Please follow the prednisone taper as prescribed and take it in the morning. Elevate your foot to help reduce swelling. Monitor your symptoms and contact us if they persist beyond one to two months.

## 2024-03-06 ENCOUNTER — Other Ambulatory Visit: Payer: Self-pay

## 2024-03-06 ENCOUNTER — Other Ambulatory Visit: Payer: Self-pay | Admitting: Podiatry

## 2024-03-06 MED ORDER — METHYLPREDNISOLONE 4 MG PO TBPK
ORAL_TABLET | ORAL | 0 refills | Status: AC
Start: 2024-03-06 — End: ?
